# Patient Record
Sex: Female | Born: 1975 | Race: White | Hispanic: No | Marital: Married | Smoking: Never smoker
Health system: Southern US, Community
[De-identification: ages and names within clinical notes are randomized; demographics above are authoritative.]

## PROBLEM LIST (undated history)

## (undated) DIAGNOSIS — F419 Anxiety disorder, unspecified: Secondary | ICD-10-CM

## (undated) HISTORY — PX: WISDOM TOOTH EXTRACTION: SHX21

## (undated) HISTORY — DX: Anxiety disorder, unspecified: F41.9

---

## 2006-07-03 ENCOUNTER — Other Ambulatory Visit: Admission: RE | Admit: 2006-07-03 | Discharge: 2006-07-03 | Payer: Self-pay | Admitting: Obstetrics and Gynecology

## 2008-12-12 ENCOUNTER — Ambulatory Visit: Payer: Self-pay | Admitting: Obstetrics and Gynecology

## 2008-12-12 ENCOUNTER — Inpatient Hospital Stay (HOSPITAL_COMMUNITY): Admission: AD | Admit: 2008-12-12 | Discharge: 2008-12-12 | Payer: Self-pay | Admitting: Obstetrics & Gynecology

## 2009-03-30 ENCOUNTER — Inpatient Hospital Stay (HOSPITAL_COMMUNITY): Admission: AD | Admit: 2009-03-30 | Discharge: 2009-04-02 | Payer: Self-pay | Admitting: Obstetrics and Gynecology

## 2010-12-01 LAB — CBC
HCT: 30.2 % — ABNORMAL LOW (ref 36.0–46.0)
HCT: 38.9 % (ref 36.0–46.0)
Hemoglobin: 10.3 g/dL — ABNORMAL LOW (ref 12.0–15.0)
MCHC: 34.1 g/dL (ref 30.0–36.0)
MCV: 92.1 fL (ref 78.0–100.0)
MCV: 92.3 fL (ref 78.0–100.0)
Platelets: 154 K/uL (ref 150–400)
Platelets: 167 10*3/uL (ref 150–400)
RBC: 3.28 MIL/uL — ABNORMAL LOW (ref 3.87–5.11)
RDW: 14.6 % (ref 11.5–15.5)
RDW: 14.9 % (ref 11.5–15.5)
WBC: 13 K/uL — ABNORMAL HIGH (ref 4.0–10.5)

## 2010-12-01 LAB — SYPHILIS: RPR W/REFLEX TO RPR TITER AND TREPONEMAL ANTIBODIES, TRADITIONAL SCREENING AND DIAGNOSIS ALGORITHM: RPR Ser Ql: NONREACTIVE

## 2010-12-05 LAB — GLUCOSE, CAPILLARY: Glucose-Capillary: 102 mg/dL — ABNORMAL HIGH (ref 70–99)

## 2011-01-08 NOTE — H&P (Signed)
NAMEJAMES, LAFALCE                 ACCOUNT NO.:  [id number]   MEDICAL RECORD NO.:  [id number]          PATIENT TYPE:  INP   LOCATION:  9162                          FACILITY:  [facility]   PHYSICIAN:  Kendra H. Tenny Craw, MD     DATE OF BIRTH:  01-Aug-1976   DATE OF ADMISSION:  03/30/2009  DATE OF DISCHARGE:                              HISTORY & PHYSICAL   CHIEF COMPLAINT:  Contractions.   HISTORY OF PRESENT ILLNESS:  Ms. Tedesco is a 35 year old gravida 2, para  0-0-1-0 who presents at 63 weeks and 2 days estimated gestational age,  complaining of contractions.  She was seen in the office earlier today  and was found to be 4 cm dilated but contracting irregularly.  She  returned later this evening complaining of painful contractions and was  found to be 7 cm dilated, and was admitted to labor and delivery for  labor.  This pregnancy has been relatively uncomplicated except for an  incidental finding of a left lateral uterine fibroid which measures 6 cm  on a first trimester ultrasound.  She also had an abnormal 1-hour  Glucola of 166, but her 3-hour Glucola was normal.   PAST MEDICAL HISTORY:  1. Uterine fibroids.  2. Eczema.   PAST SURGICAL HISTORY:  Oral surgery in childhood.   PAST OB HISTORY:  G2, P 0-0-1-0  1. June 2009: First trimester missed abortion.   PAST GYN HISTORY:  No abnormal Pap smears.  Left lateral uterine fibroid  noted to be 6 cm in a first trimester ultrasound.   MEDICATIONS:  Prenatal vitamins.   ALLERGIES:  SULFA.   SOCIAL HISTORY:  Negative x3.   PRENATAL LABS:  Blood type O+, Rh antibody screen negative, RPR  nonreactive, Rubella titer immune, hepatitis B surface antigen negative,  HIV nonreactive.  Urine cultures:  Group B strep positive.  First  trimester screen:  No increased risk.  Second trimester maternal serum  AFP within normal limits.  A 1-hour Glucola 166.  A 3-hour Glucola  normal.  Pap smear within normal limits.  Gonorrhea and Chlamydia  negative.   PHYSICAL EXAMINATION:  Alert and oriented x3.  Uncomfortable-appearing.  ABDOMEN:  Gravid, soft, nontender.  CERVIX:  790, bulging bag.  Fetal heart tones 140 to 150, reactive.  No decels.  Toco every 2 to 4  minutes.   ASSESSMENT AND PLAN:  This is a 35 year old gravida 2, para 0-0-1-0 at  39 weeks and 2 days in labor.  1. Will admit to labor and delivery.  2. Given group B positive status and advanced cervical dilation, we      will administer ampicillin for group B strep prophylaxis.  3)      Patient may have an epidural upon request.      Freddrick March. Tenny Craw, MD  Electronically Signed     [facility]MED[facility]  D:  03/30/2009  T:  03/30/2009  Job:  [id number]

## 2015-06-15 ENCOUNTER — Other Ambulatory Visit (HOSPITAL_COMMUNITY): Payer: Self-pay | Admitting: Family Medicine

## 2015-06-15 ENCOUNTER — Ambulatory Visit (HOSPITAL_COMMUNITY): Payer: Self-pay

## 2015-06-15 DIAGNOSIS — R102 Pelvic and perineal pain: Secondary | ICD-10-CM

## 2015-06-15 DIAGNOSIS — R1030 Lower abdominal pain, unspecified: Secondary | ICD-10-CM

## 2015-06-15 DIAGNOSIS — N912 Amenorrhea, unspecified: Secondary | ICD-10-CM

## 2016-07-03 ENCOUNTER — Other Ambulatory Visit: Payer: Self-pay | Admitting: Obstetrics and Gynecology

## 2016-07-03 DIAGNOSIS — R928 Other abnormal and inconclusive findings on diagnostic imaging of breast: Secondary | ICD-10-CM

## 2016-07-15 ENCOUNTER — Ambulatory Visit
Admission: RE | Admit: 2016-07-15 | Discharge: 2016-07-15 | Disposition: A | Discharge status: Home / Self Care | Payer: Self-pay | Source: Ambulatory Visit | Attending: Obstetrics and Gynecology | Admitting: Obstetrics and Gynecology

## 2016-07-15 ENCOUNTER — Ambulatory Visit
Admission: RE | Admit: 2016-07-15 | Discharge: 2016-07-15 | Disposition: A | Discharge status: Home / Self Care | Payer: BLUE CROSS/BLUE SHIELD | Source: Ambulatory Visit | Attending: Obstetrics and Gynecology | Admitting: Obstetrics and Gynecology

## 2016-07-15 DIAGNOSIS — R928 Other abnormal and inconclusive findings on diagnostic imaging of breast: Secondary | ICD-10-CM

## 2016-07-15 IMAGING — MG 2D DIGITAL DIAGNOSTIC UNILATERAL LEFT MAMMOGRAM WITH CAD AND ADJ
3 series · 3 of 7 positions shown · non-contrast
Comparison: Baseline screening mammogram dated [DATE].

CLINICAL DATA: Patient was called back from screening mammogram for
an asymmetry in the left breast.

EXAM:
2D DIGITAL DIAGNOSTIC LEFT MAMMOGRAM WITH CAD AND ADJUNCT TOMO
ULTRASOUND LEFT BREAST

[L MLO synth-2D]
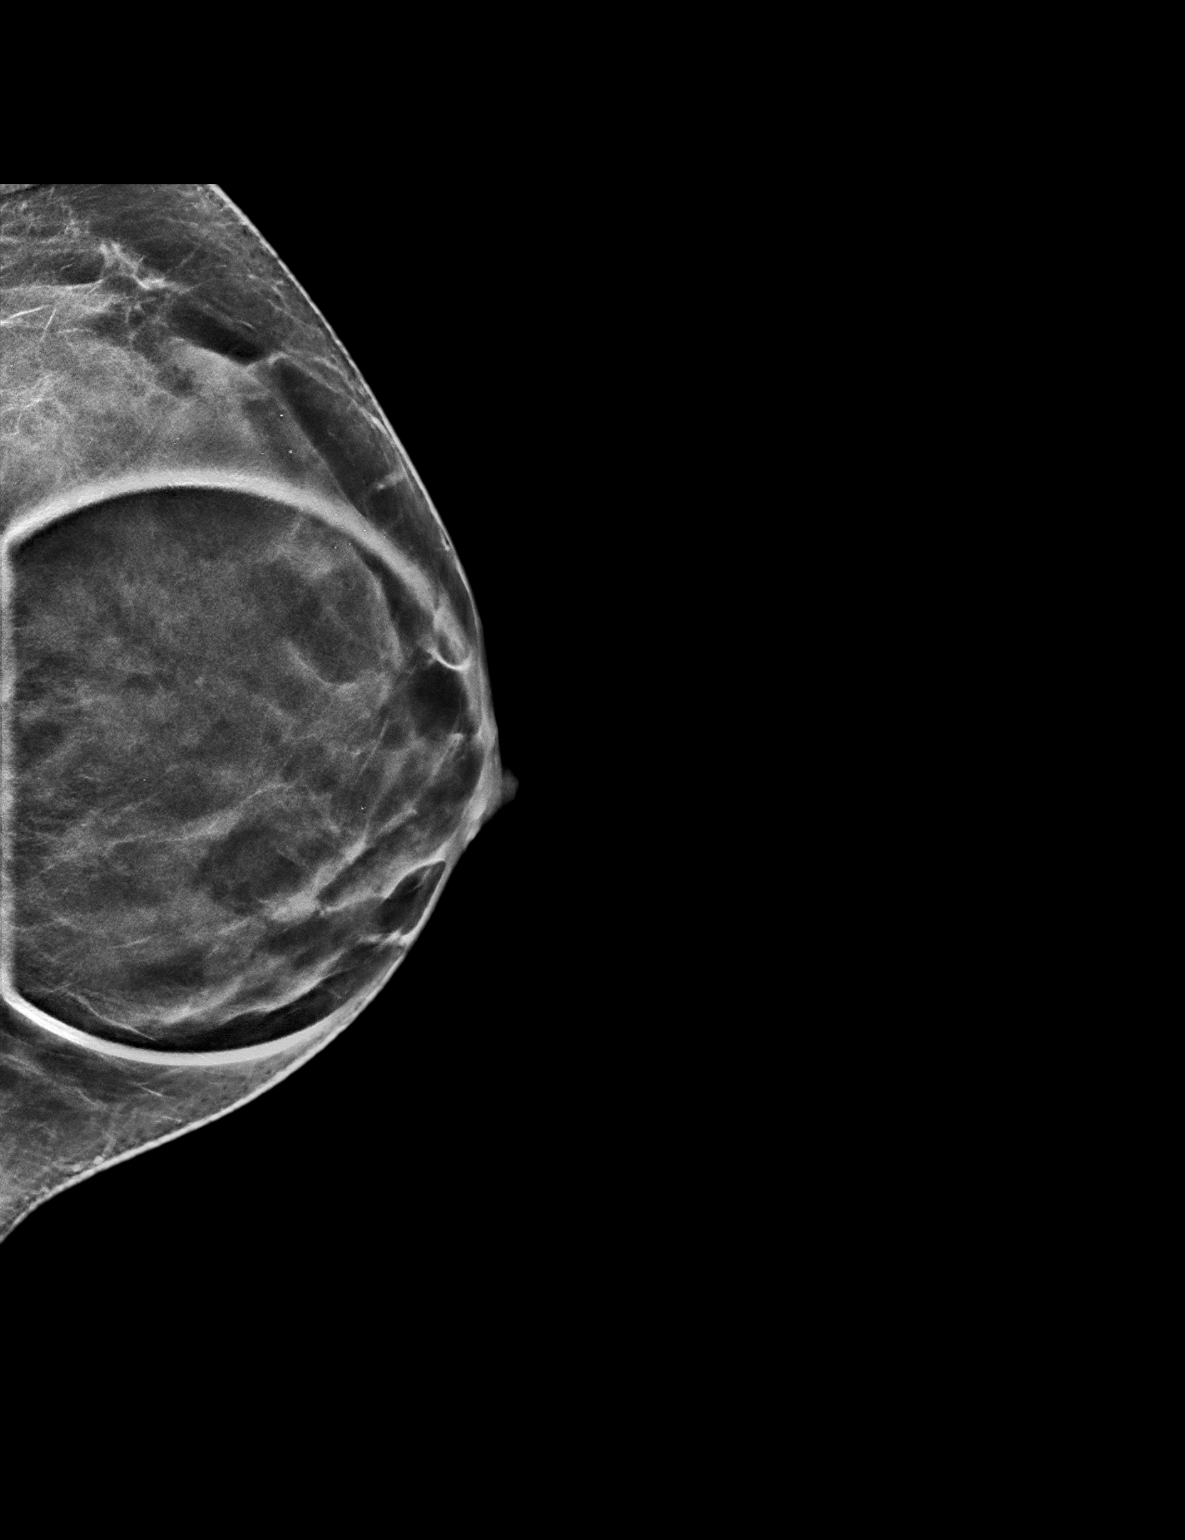

[L MLO]
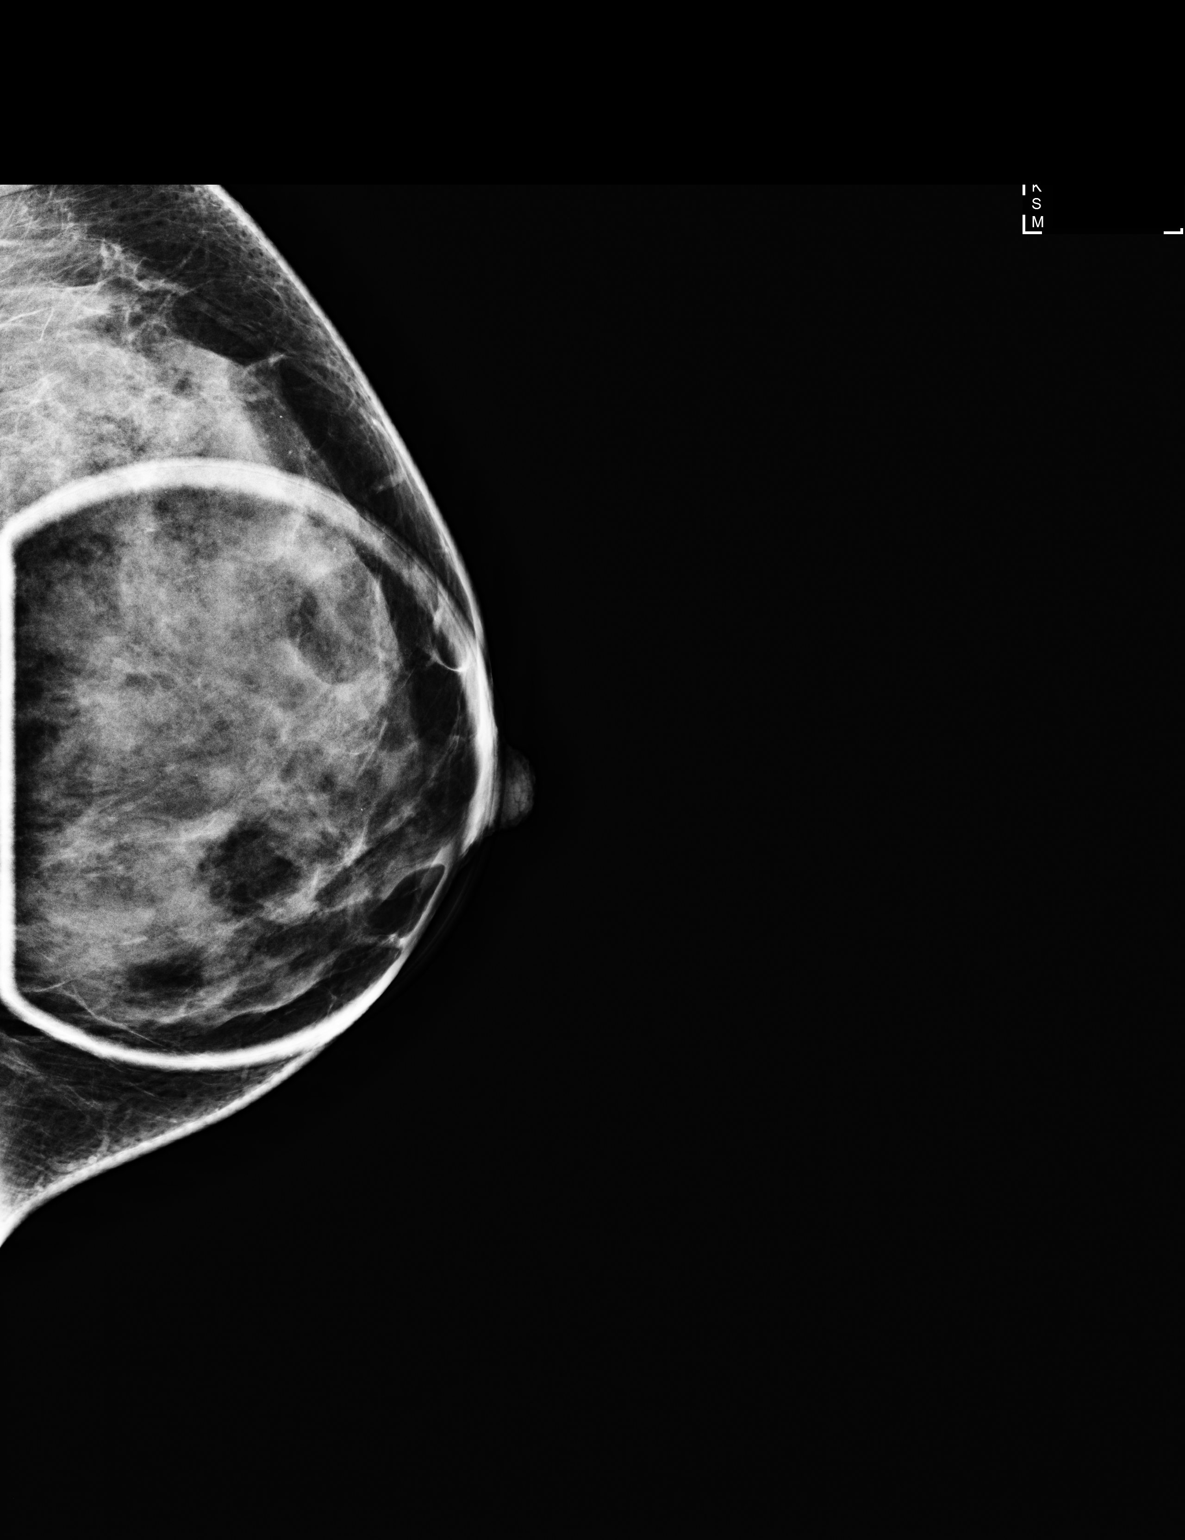

[L CC tomo · tomo slice 26/51.0]
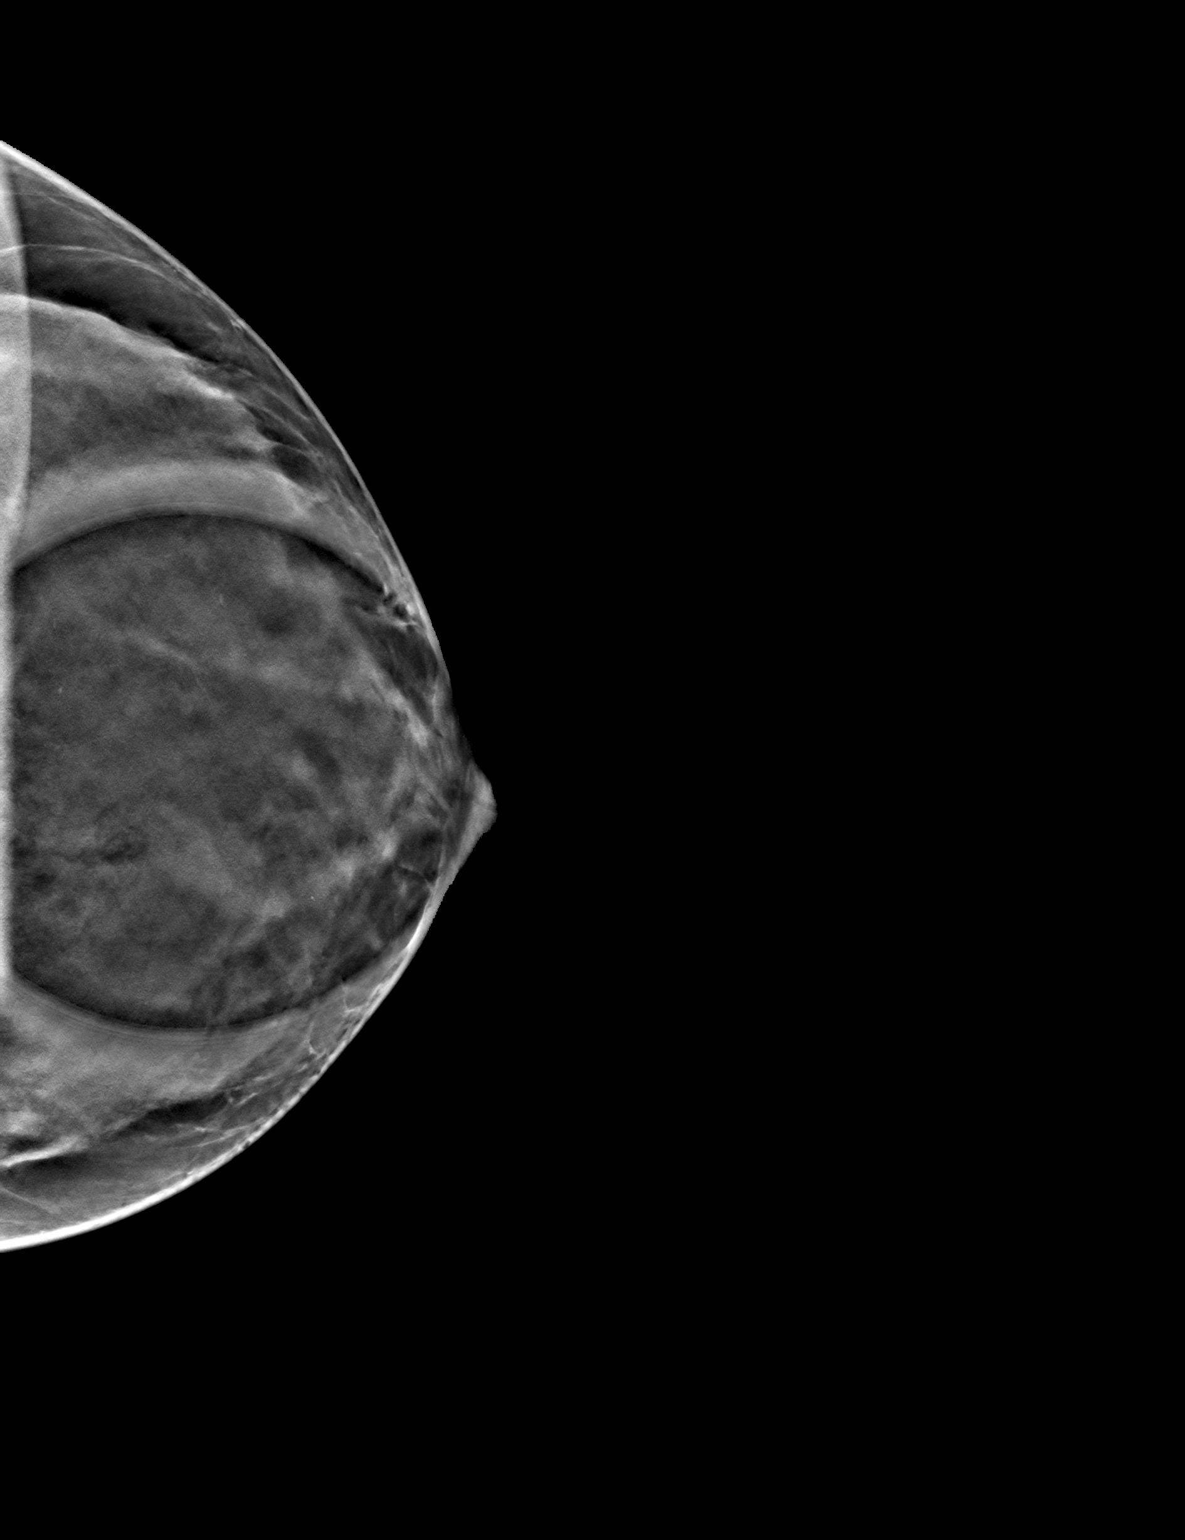

[3 of 7 positions shown; findings below may reference images not displayed]

ACR Breast Density Category c: The breast tissue is heterogeneously
dense, which may obscure small masses.
FINDINGS: Additional imaging of the left breast was performed. No suspicious
mass, distortion or malignant type microcalcifications identified in
the left breast.

Mammographic images were processed with CAD.

On physical exam, I do not palpate a mass in the left breast.

Targeted ultrasound is performed, showing near anechoic cysts
(apocrine metaplasia) in the left breast at 5 o'clock 1 cm from the
nipple measuring 4 x 3 x 5 mm.
IMPRESSION: Probable benign cysts in the left breast.

RECOMMENDATION:
Short-term interval follow-up left breast ultrasound in 6 months is
recommended.

I have discussed the findings and recommendations with the patient.
Results were also provided in writing at the conclusion of the
visit. If applicable, a reminder letter will be sent to the patient
regarding the next appointment.

BI-RADS CATEGORY  3: Probably benign.

## 2017-07-04 ENCOUNTER — Other Ambulatory Visit: Payer: Self-pay | Admitting: Obstetrics and Gynecology

## 2017-07-04 DIAGNOSIS — N6489 Other specified disorders of breast: Secondary | ICD-10-CM

## 2017-07-09 ENCOUNTER — Ambulatory Visit
Admission: RE | Admit: 2017-07-09 | Discharge: 2017-07-09 | Disposition: A | Discharge status: Home / Self Care | Payer: BLUE CROSS/BLUE SHIELD | Source: Ambulatory Visit | Attending: Obstetrics and Gynecology | Admitting: Obstetrics and Gynecology

## 2017-07-09 DIAGNOSIS — N6489 Other specified disorders of breast: Secondary | ICD-10-CM

## 2017-07-09 IMAGING — MG 2D DIGITAL DIAGNOSTIC BILATERAL MAMMOGRAM WITH CAD AND ADJUNCT T
8 of 12 series · 8 of 28 positions shown · non-contrast
Comparison: Previous exam(s).

CLINICAL DATA: Diagnostic report of [DATE] described a probably
benign cyst in the left breast with recommendation for six-month
follow-up diagnostic exam. Patient returns today for the follow-up
diagnostic exam.

EXAM:
2D DIGITAL DIAGNOSTIC BILATERAL MAMMOGRAM WITH CAD AND ADJUNCT TOMO
ULTRASOUND LEFT BREAST

[R CC synth-2D]
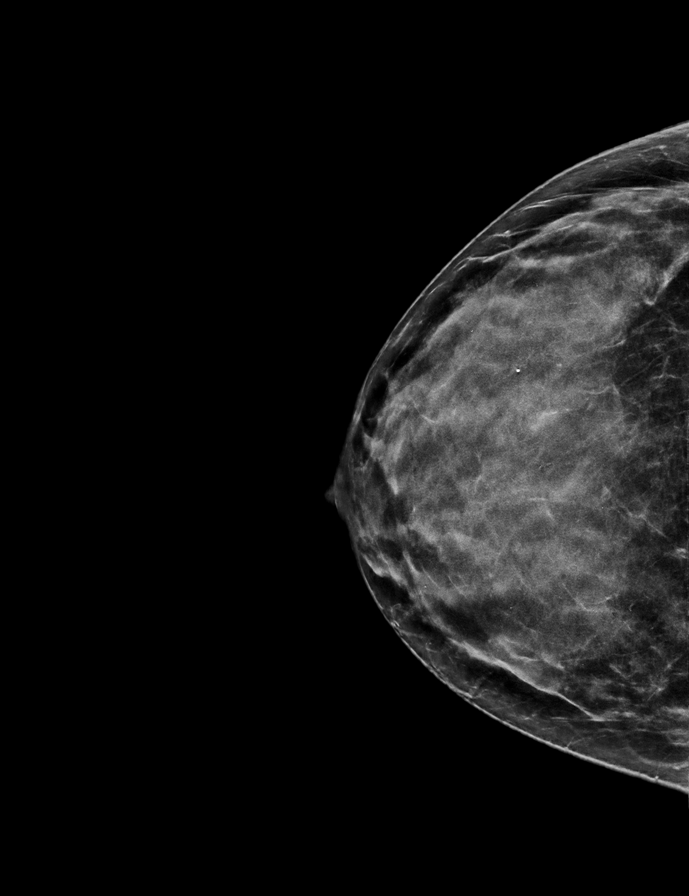

[L CC]
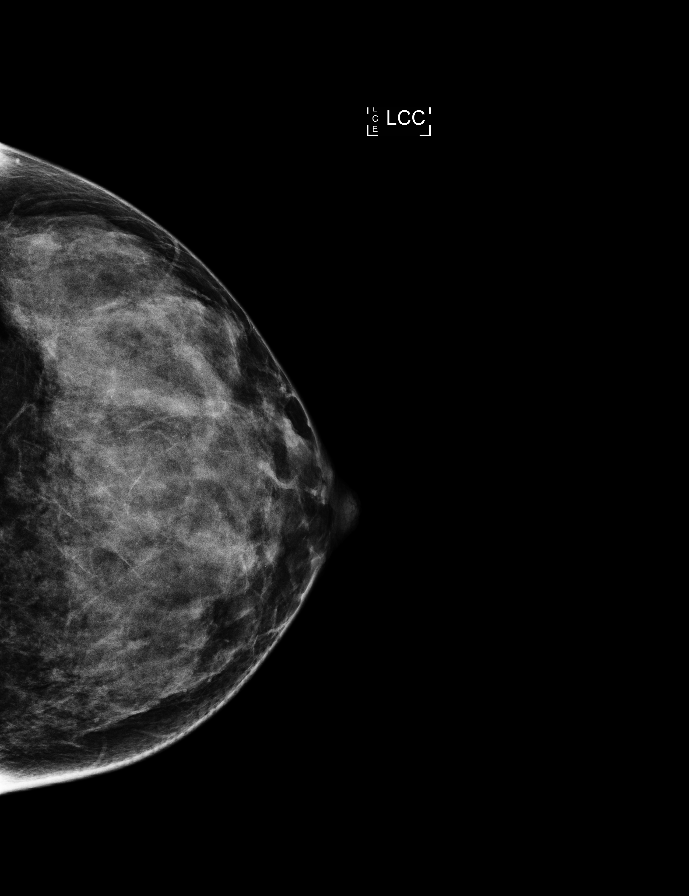

[R MLO]
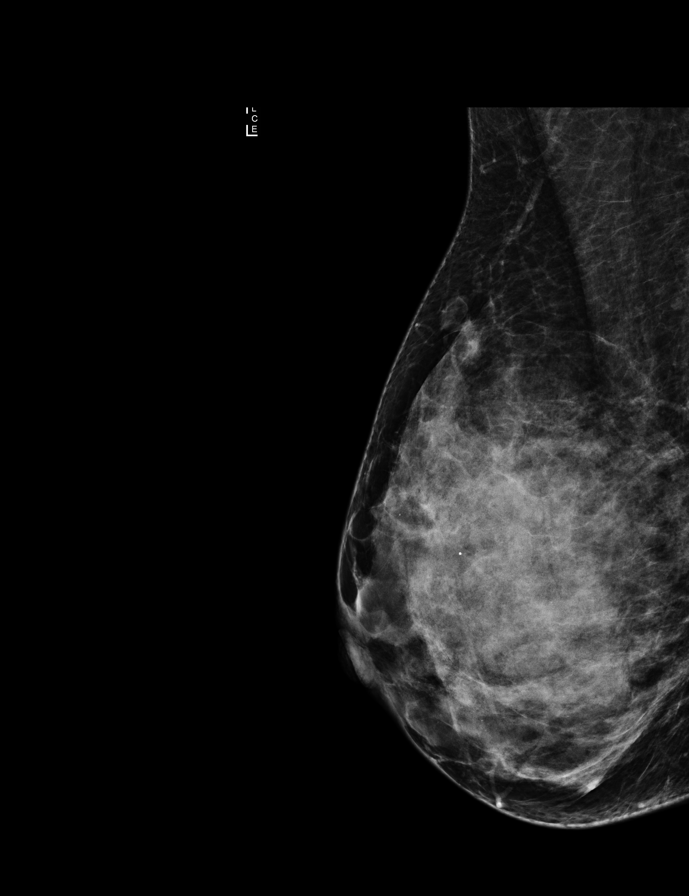

[L MLO synth-2D]
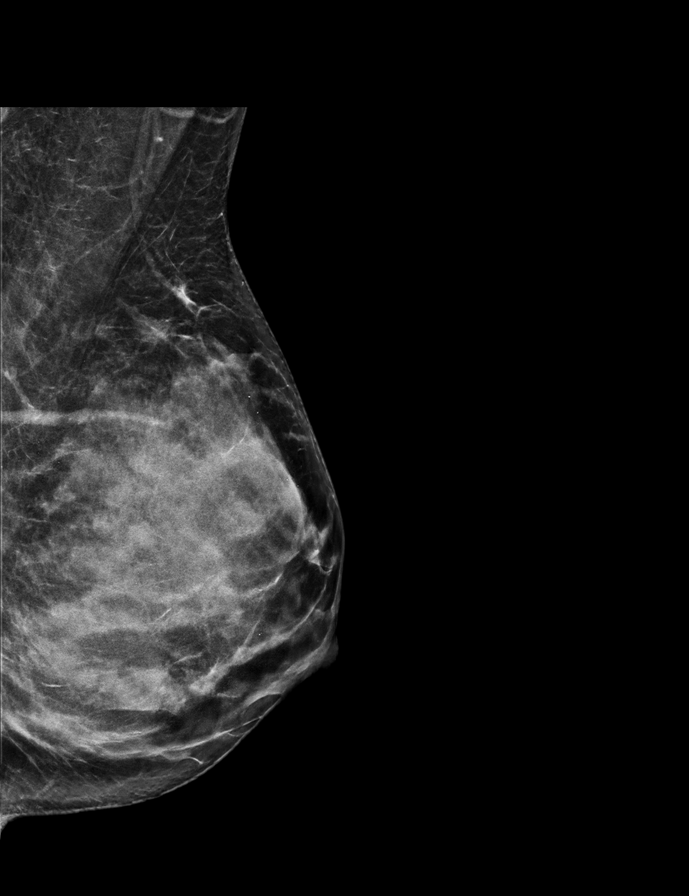

[L CC synth-2D]
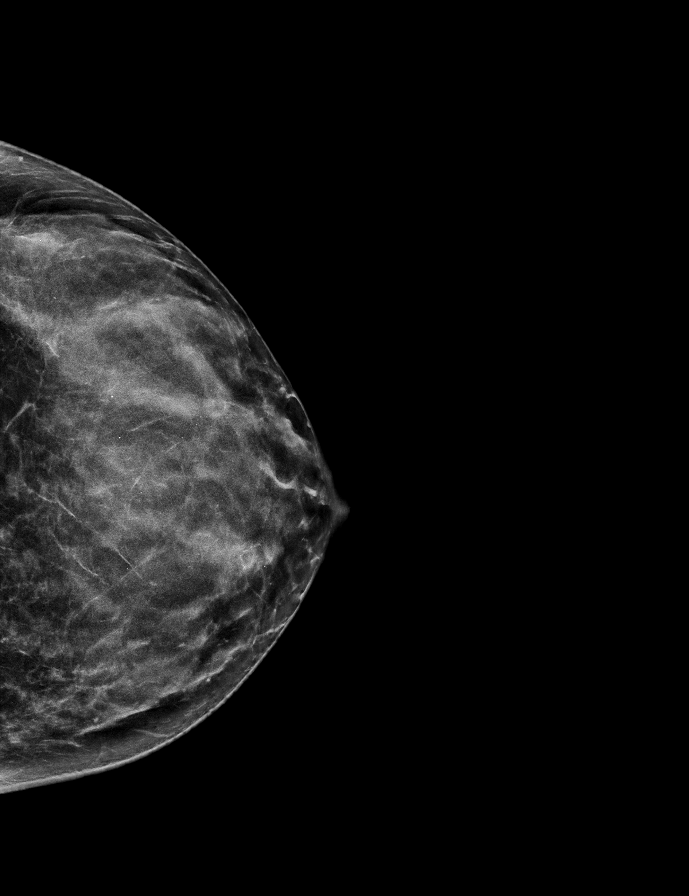

[R CC]
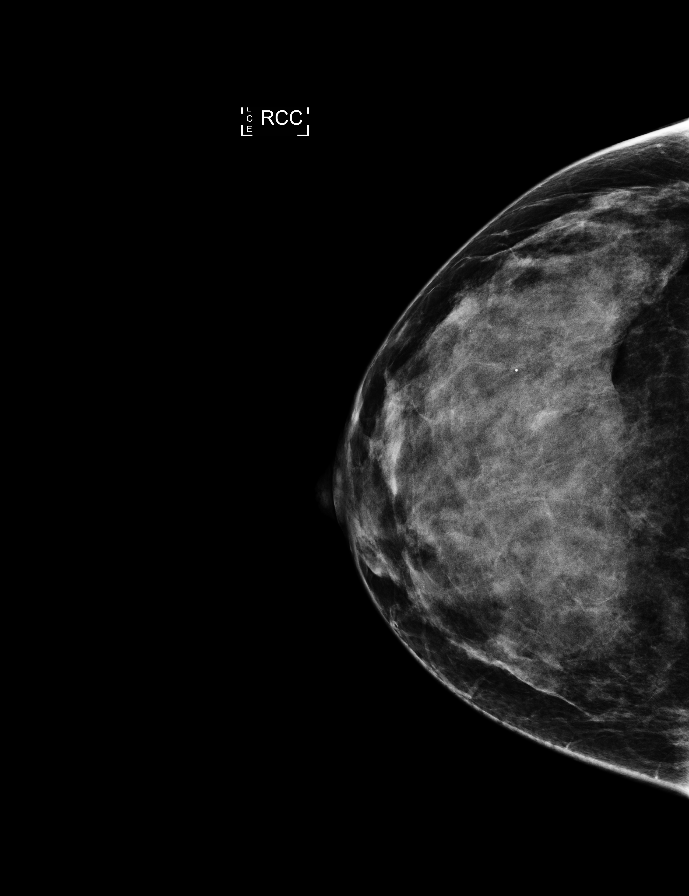

[R MLO synth-2D]
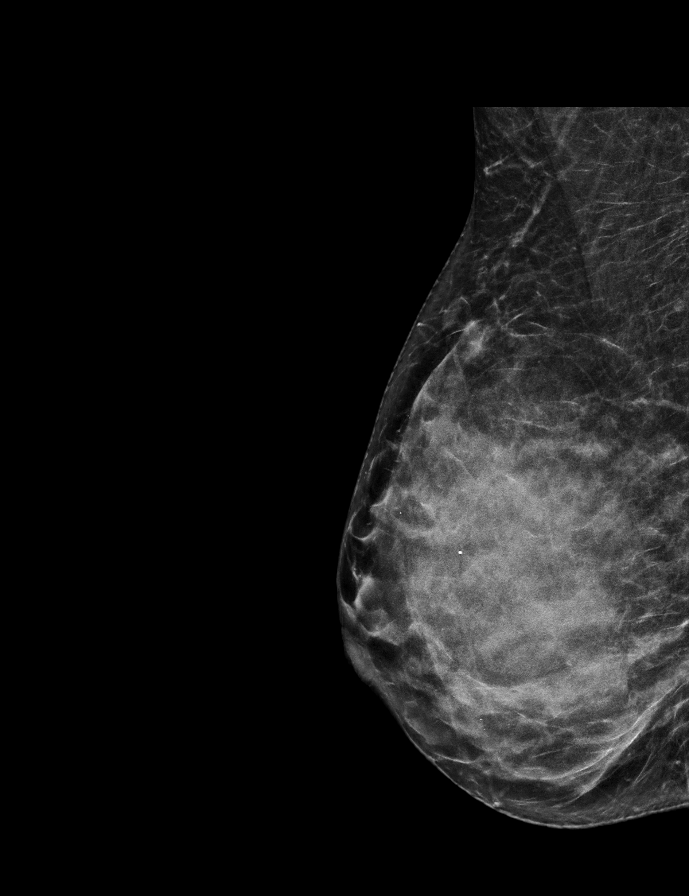

[L MLO]
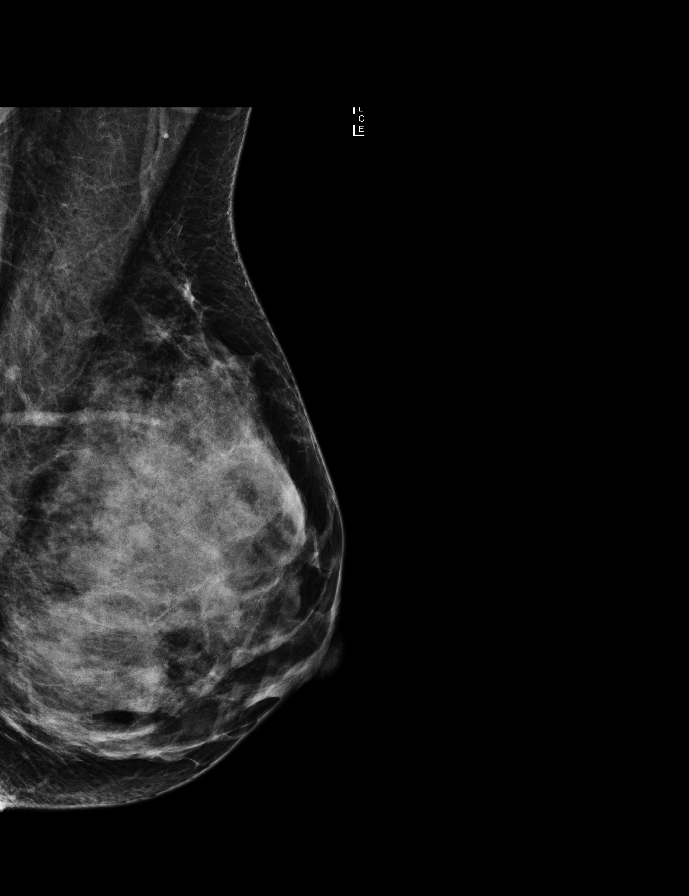

[8 of 28 positions shown; findings below may reference images not displayed]

ACR Breast Density Category d: The breast tissue is extremely dense,
which lowers the sensitivity of mammography.
FINDINGS: Bilateral 2D CC and MLO views were obtained today, with additional
3D tomosynthesis. There are no dominant masses, suspicious
calcifications or secondary signs of malignancy identified within
either breast on today's exam.

Mammographic images were processed with CAD.

Targeted ultrasound is performed, showing interval resolution of the
previously demonstrated cyst at the 5 o'clock axis confirming
benignity. There is a benign cluster of cysts in the left breast at
the 4 o'clock axis, 3 cm from the nipple, corresponding as an
incidental finding. Additional smaller cysts are seen within the
lower left breast, also corresponding as incidental findings. No
suspicious solid or cystic mass is identified within the lower left
breast on today's ultrasound.
IMPRESSION: No evidence of malignancy within either breast. Benign cysts within
the lower left breast.

Patient may return to routine annual bilateral screening mammogram
schedule.

RECOMMENDATION:
Screening mammogram in one year.(Code:[62])

I have discussed the findings and recommendations with the patient.
Results were also provided in writing at the conclusion of the
visit. If applicable, a reminder letter will be sent to the patient
regarding the next appointment.

BI-RADS CATEGORY  2: Benign.

## 2017-07-09 IMAGING — US ULTRASOUND LEFT BREAST LIMITED
1 series · 4 of 4 positions shown · non-contrast
Comparison: Previous exam(s).

CLINICAL DATA: Diagnostic report of [DATE] described a probably
benign cyst in the left breast with recommendation for six-month
follow-up diagnostic exam. Patient returns today for the follow-up
diagnostic exam.

EXAM:
2D DIGITAL DIAGNOSTIC BILATERAL MAMMOGRAM WITH CAD AND ADJUNCT TOMO
ULTRASOUND LEFT BREAST

[Series 1: ultrasound left breast limited · 0.06mm/px · 4 of 4 slices shown]
[im 1/4]
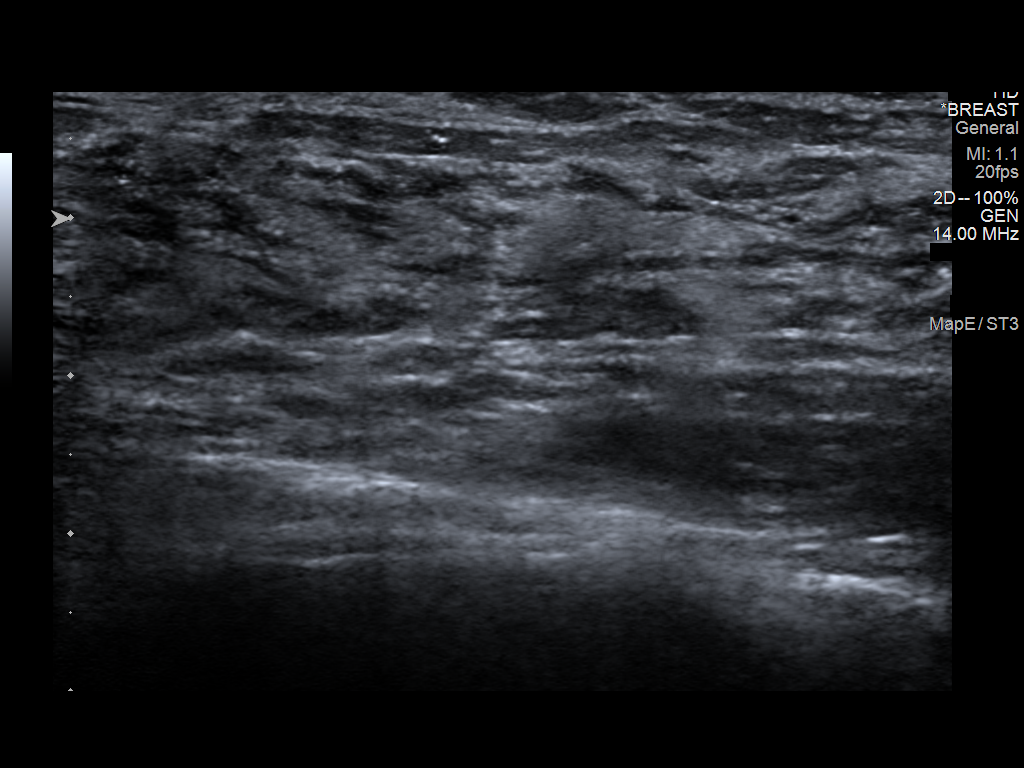
[im 2/4]
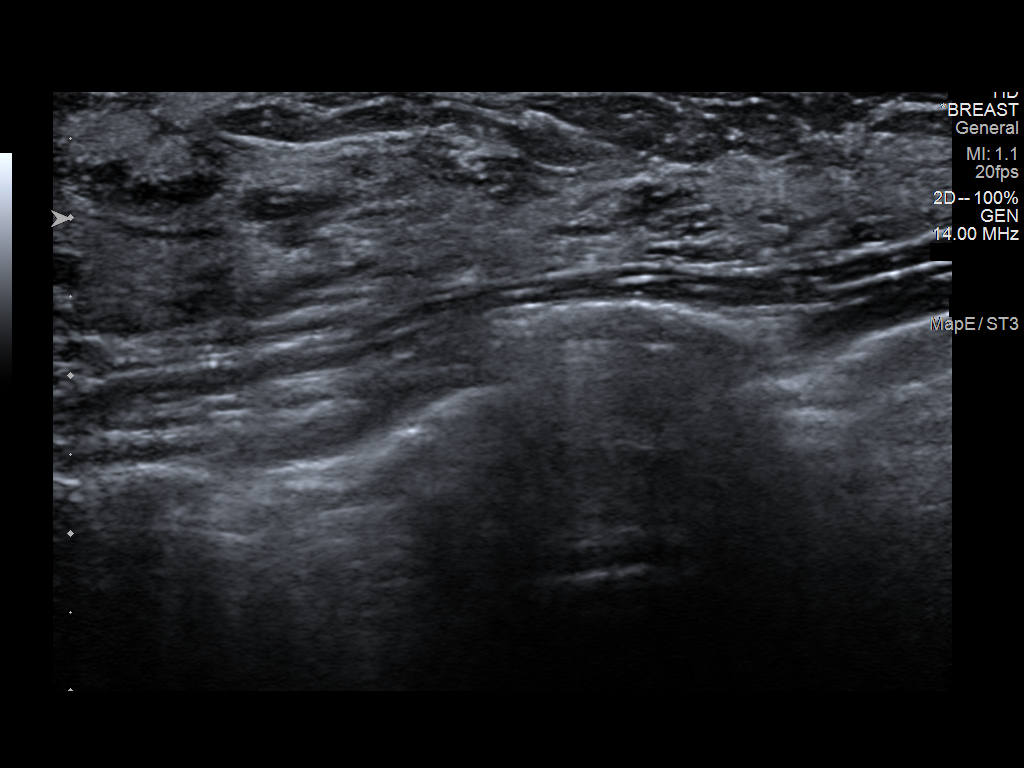
[im 3/4]
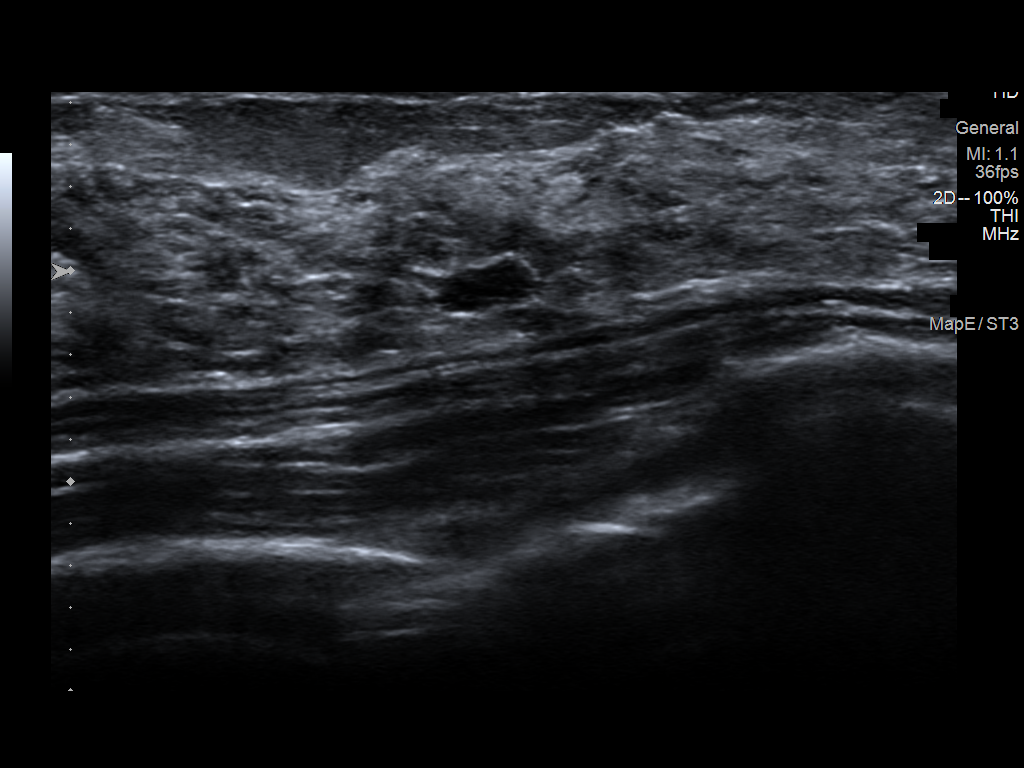
[im 4/4]
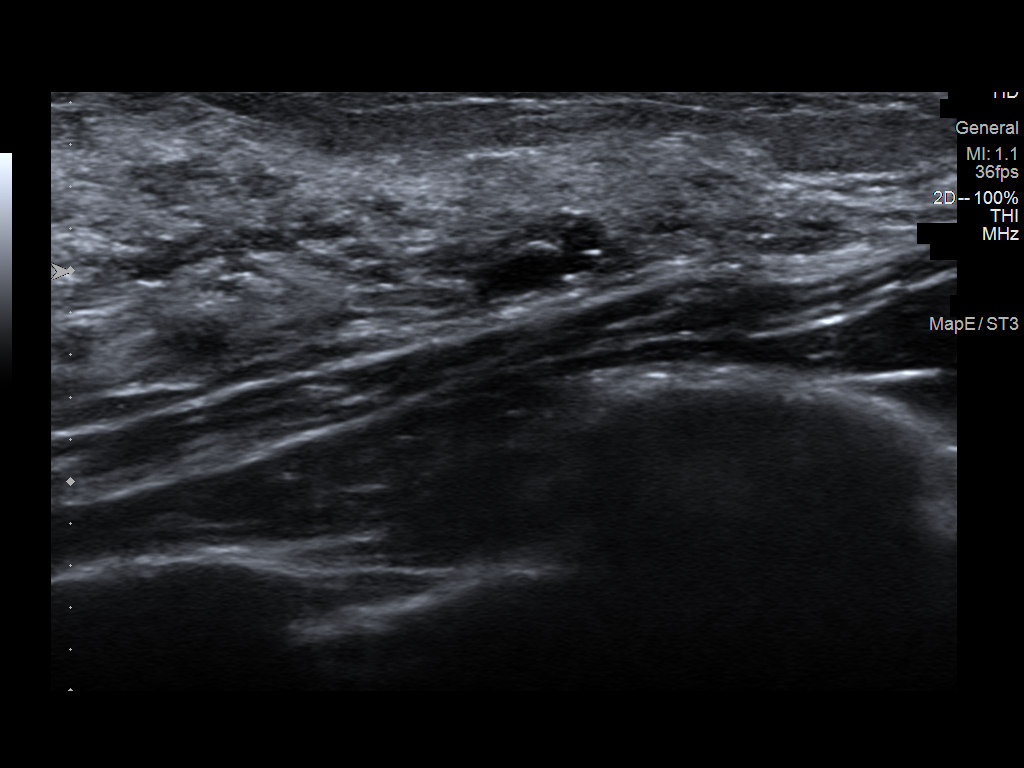

[4 of 4 positions shown; findings below may reference images not displayed]

ACR Breast Density Category d: The breast tissue is extremely dense,
which lowers the sensitivity of mammography.
FINDINGS: Bilateral 2D CC and MLO views were obtained today, with additional
3D tomosynthesis. There are no dominant masses, suspicious
calcifications or secondary signs of malignancy identified within
either breast on today's exam.

Mammographic images were processed with CAD.

Targeted ultrasound is performed, showing interval resolution of the
previously demonstrated cyst at the 5 o'clock axis confirming
benignity. There is a benign cluster of cysts in the left breast at
the 4 o'clock axis, 3 cm from the nipple, corresponding as an
incidental finding. Additional smaller cysts are seen within the
lower left breast, also corresponding as incidental findings. No
suspicious solid or cystic mass is identified within the lower left
breast on today's ultrasound.
IMPRESSION: No evidence of malignancy within either breast. Benign cysts within
the lower left breast.

Patient may return to routine annual bilateral screening mammogram
schedule.

RECOMMENDATION:
Screening mammogram in one year.(Code:[62])

I have discussed the findings and recommendations with the patient.
Results were also provided in writing at the conclusion of the
visit. If applicable, a reminder letter will be sent to the patient
regarding the next appointment.

BI-RADS CATEGORY  2: Benign.

## 2020-08-26 HISTORY — PX: WRIST SURGERY: SHX841

## 2020-09-20 ENCOUNTER — Other Ambulatory Visit: Payer: Self-pay | Admitting: Obstetrics and Gynecology

## 2020-09-20 DIAGNOSIS — Z9189 Other specified personal risk factors, not elsewhere classified: Secondary | ICD-10-CM

## 2020-10-08 ENCOUNTER — Other Ambulatory Visit: Payer: Self-pay

## 2020-10-08 ENCOUNTER — Ambulatory Visit
Admission: RE | Admit: 2020-10-08 | Discharge: 2020-10-08 | Disposition: A | Discharge status: Home / Self Care | Payer: BC Managed Care – PPO | Source: Ambulatory Visit | Attending: Obstetrics and Gynecology | Admitting: Obstetrics and Gynecology

## 2020-10-08 DIAGNOSIS — Z9189 Other specified personal risk factors, not elsewhere classified: Secondary | ICD-10-CM

## 2020-10-08 IMAGING — MR MR BREAST BILAT WO/W CM
16 of 24 series · 32 of 48 positions shown · IV contrast (gadavist)
Comparison: Previous exam(s).

CLINICAL DATA: 44-year-old female presenting for high risk
screening MRI. Mother, grandmother and aunt all diagnosed with
breast cancer. No current breast related symptoms.

LABS:  None performed on site.
EXAM:
BILATERAL BREAST MRI WITH AND WITHOUT CONTRAST
TECHNIQUE: Multiplanar, multisequence MR images of both breasts were obtained
prior to and following the intravenous administration of 10 ml of
Gadavist.

[Series 2: t2_tirm_tra ipat (a-p) · axial · 3.0mm · 0.66mm/px · 1 of 55 slices shown (1 of 2)]
[im 1/55]
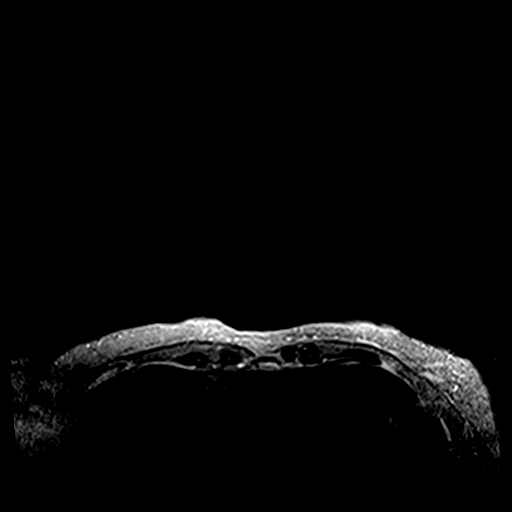

[Series 2: t2_tirm_tra ipat (a-p) · axial · 3.0mm · 0.70mm/px · 1 of 55 slices shown (2 of 2)]
[im 1/55]
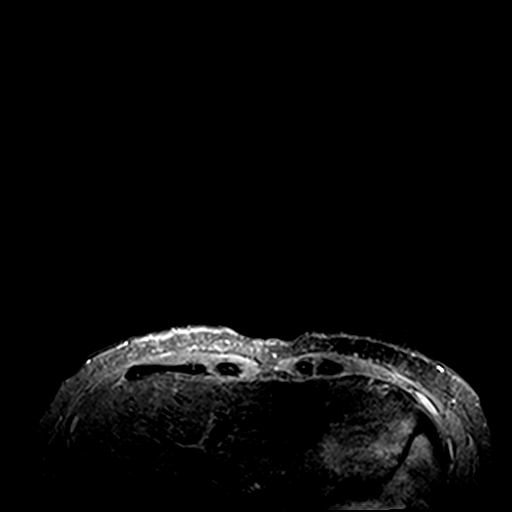

[Series 3: fl3d pre-cm no · axial · non-contrast · 1.2mm · 0.89mm/px · z∈[-50,+121]mm · 2 of 144 slices shown (1 of 2)]
[im 1/144]
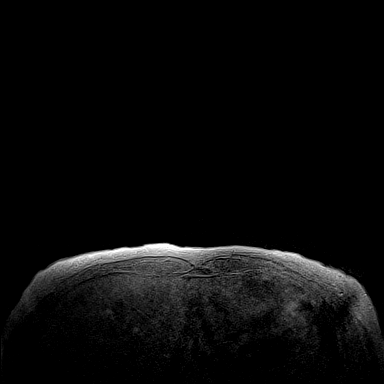
[im 144/144]
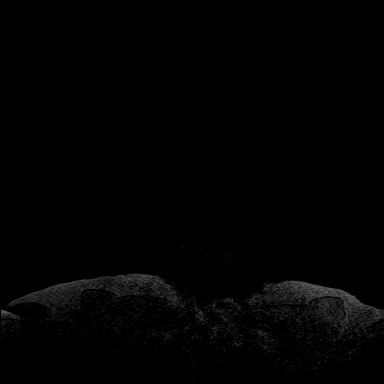

[Series 3: fl3d pre-cm no · axial · non-contrast · 1.2mm · 0.94mm/px · z∈[-45,+126]mm · 2 of 144 slices shown (2 of 2)]
[im 1/144]
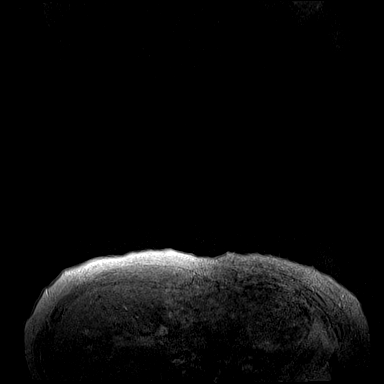
[im 144/144]
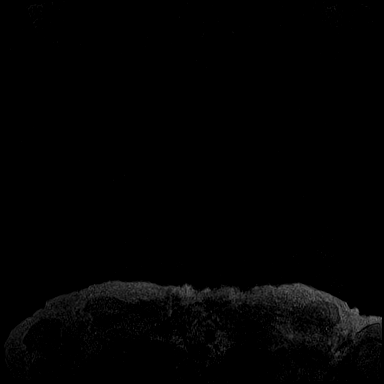

[Series 4: fl3d pre-cm · axial · non-contrast · 1.2mm · 0.89mm/px · z∈[-50,+121]mm · 2 of 144 slices shown (1 of 2)]
[im 1/144]
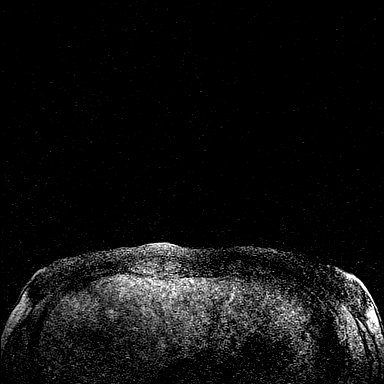
[im 144/144]
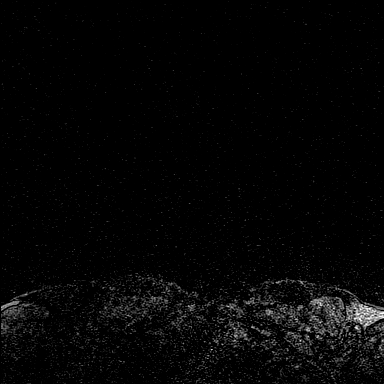

[Series 4: fl3d pre-cm · axial · non-contrast · 1.2mm · 0.94mm/px · z∈[-45,+126]mm · 2 of 144 slices shown (2 of 2)]
[im 1/144]
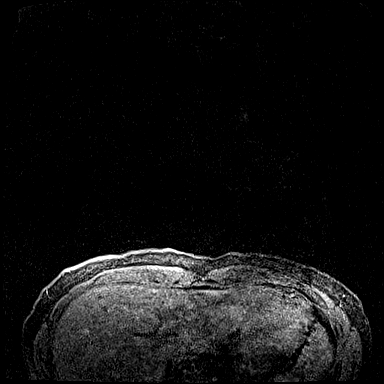
[im 144/144]
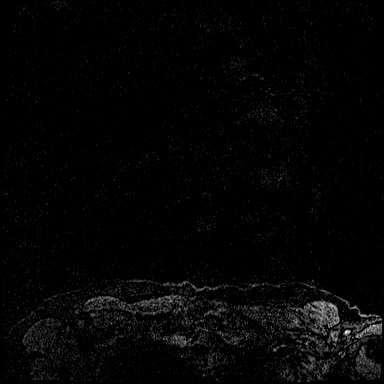

[Series 5: fl3d post immediate · axial · 1.2mm · 0.94mm/px · z∈[-45,+126]mm · 2 of 144 slices shown (1 of 3)]
[im 1/144]
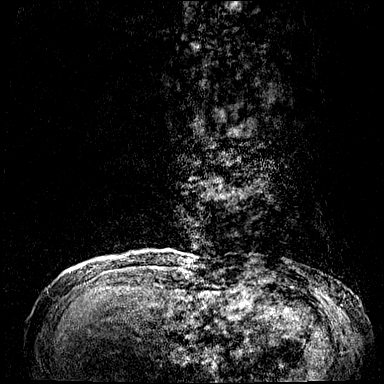
[im 144/144]
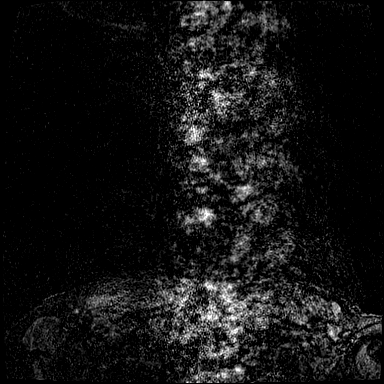

[Series 5: fl3d post-cm 20 · axial · 1.2mm · 0.89mm/px · z∈[-50,+121]mm · 2 of 144 slices shown (1 of 3)]
[im 1/144]
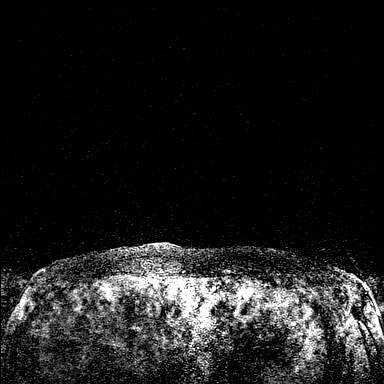
[im 144/144]
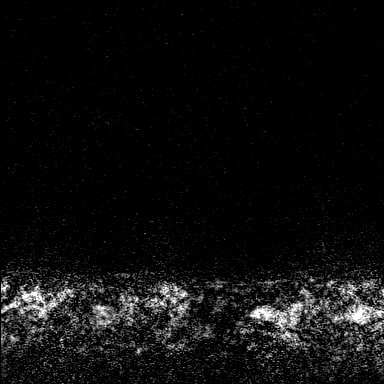

[Series 6: fl3d post-cm 20 · axial · 1.2mm · 0.89mm/px · z∈[-50,+121]mm · 2 of 144 slices shown (2 of 3)]
[im 1/144]
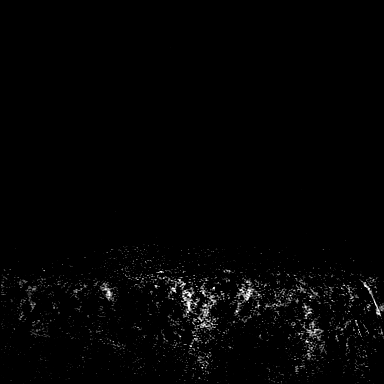
[im 144/144]
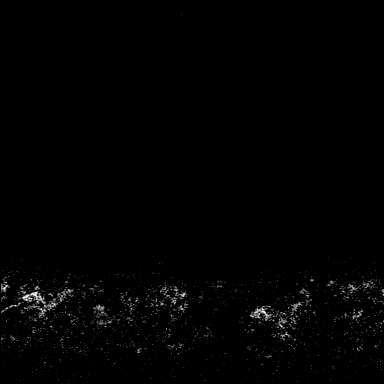

[Series 6: fl3d post immediate · axial · 1.2mm · 0.94mm/px · z∈[-45,+126]mm · 2 of 144 slices shown (2 of 3)]
[im 1/144]
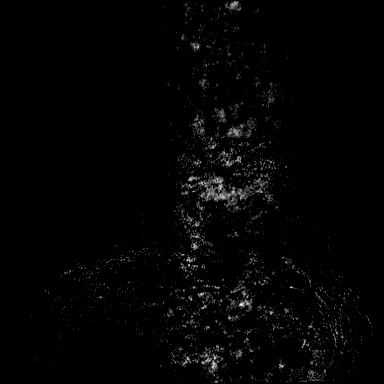
[im 144/144]
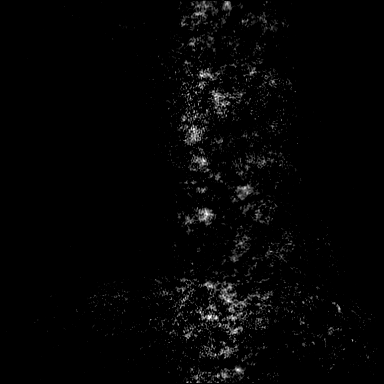

[Series 7: fl3d post-cm 20 · axial · 172.8mm · 0.89mm/px · 1 of 1 slices shown (3 of 3)]
[im 1/1]
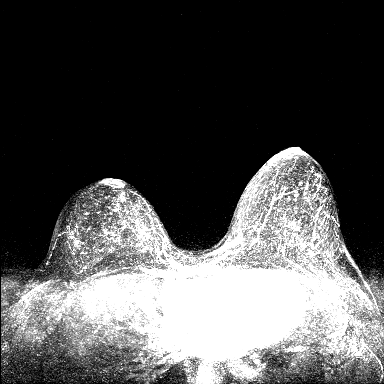

[Series 7: fl3d post immediate · axial · 172.8mm · 0.94mm/px · 1 of 1 slices shown (3 of 3)]
[im 1/1]
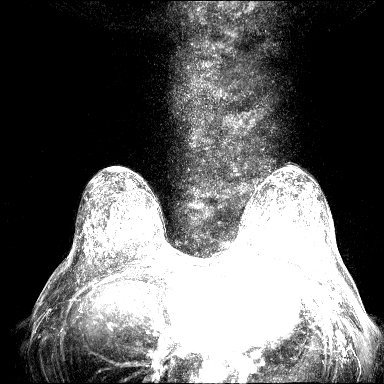

[Series 8: fl3d post-cm 3min · axial · 1.2mm · 0.89mm/px · z∈[-50,+121]mm · 3 of 144 slices shown]
[im 1/144]
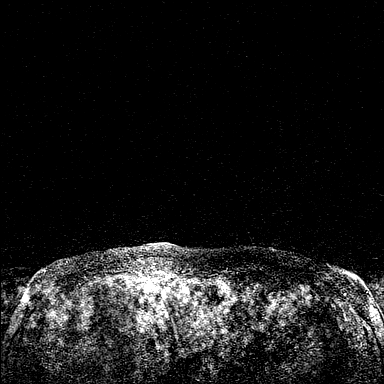
[im 72/144]
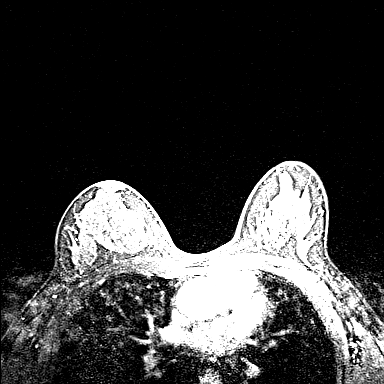
[im 144/144]
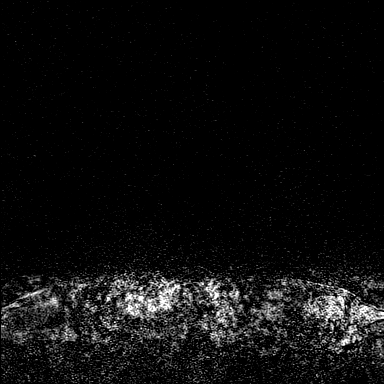

[Series 8: fl3d post 3min · axial · 1.2mm · 0.94mm/px · z∈[-45,+126]mm · 3 of 144 slices shown]
[im 1/144]
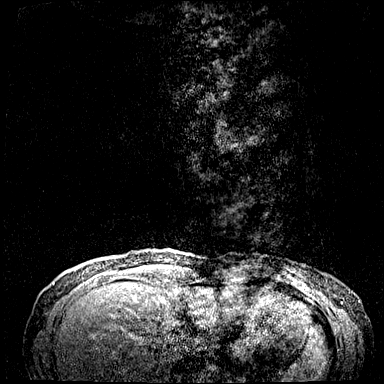
[im 72/144]
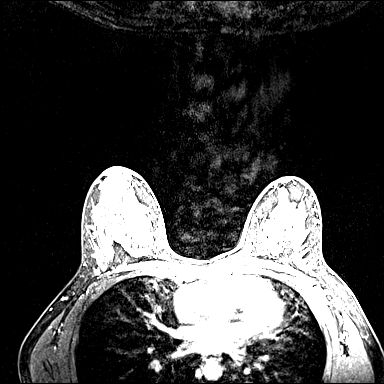
[im 144/144]
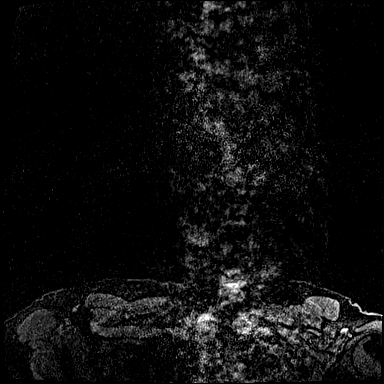

[Series 9: fl3d post-cm 3min_sub · axial · 1.2mm · 0.89mm/px · z∈[-50,+121]mm · 3 of 144 slices shown]
[im 1/144]
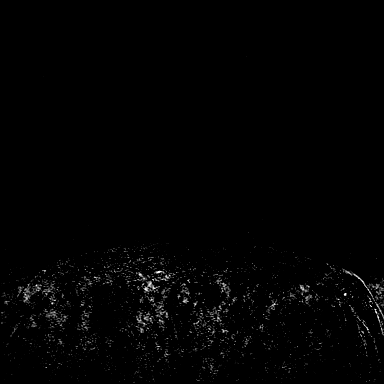
[im 72/144]
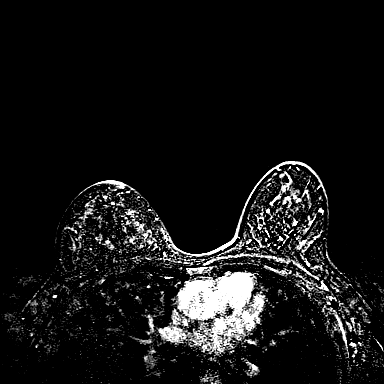
[im 144/144]
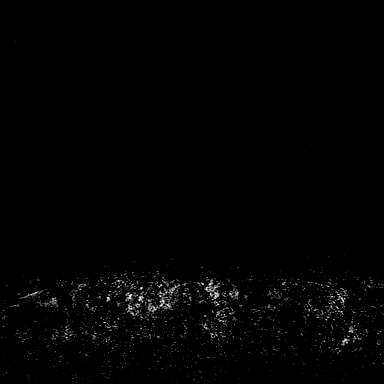

[Series 9: fl3d post 3min_sub · axial · 1.2mm · 0.94mm/px · z∈[-45,+126]mm · 3 of 144 slices shown]
[im 1/144]
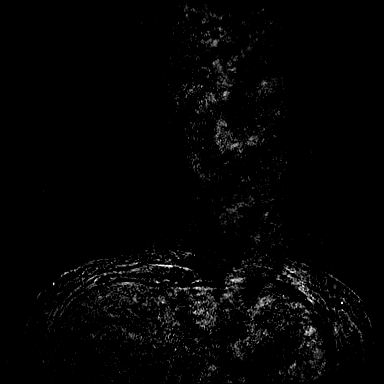
[im 72/144]
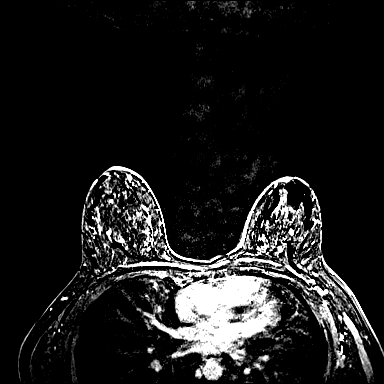
[im 144/144]
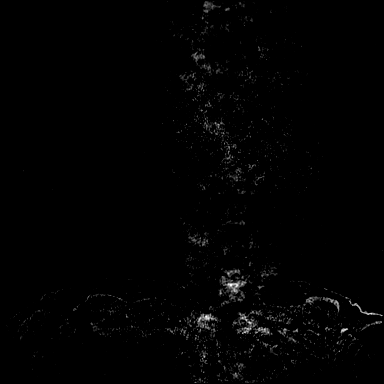

[32 of 48 positions shown; findings below may reference images not displayed]

Three-dimensional MR images were rendered by post-processing of the
original MR data on an independent workstation. The
three-dimensional MR images were interpreted, and findings are
reported in the following complete MRI report for this study. Three
dimensional images were evaluated at the independent interpreting
workstation using the DynaCAD thin client.
FINDINGS: Breast composition: d. Extreme fibroglandular tissue.

Background parenchymal enhancement: Marked.

Right breast: No suspicious mass or abnormal enhancement.

Left breast: No suspicious mass or abnormal enhancement.

Lymph nodes: No abnormal appearing lymph nodes.

Ancillary findings:  None.
IMPRESSION: No MRI evidence of malignancy in either breast. Please note,
evaluation is limited in the setting of marked background
parenchymal enhancement.

RECOMMENDATION:
Routine annual screening. The patient is due for her next screening
mammogram in [DATE].

BI-RADS CATEGORY  1: Negative.

## 2020-10-08 MED ORDER — GADOBUTROL 1 MMOL/ML IV SOLN
10.0000 mL | Freq: Once | INTRAVENOUS | Status: AC | PRN
  Administered 2020-10-08: 10 mL via INTRAVENOUS

## 2020-10-10 ENCOUNTER — Other Ambulatory Visit: Payer: Self-pay

## 2020-10-10 ENCOUNTER — Inpatient Hospital Stay: Admission: RE | Admit: 2020-10-10 | Payer: BC Managed Care – PPO | Source: Ambulatory Visit

## 2021-11-08 ENCOUNTER — Other Ambulatory Visit: Payer: Self-pay | Admitting: Obstetrics and Gynecology

## 2021-11-16 ENCOUNTER — Ambulatory Visit
Admission: RE | Admit: 2021-11-16 | Discharge: 2021-11-16 | Disposition: A | Discharge status: Home / Self Care | Payer: BC Managed Care – PPO | Source: Ambulatory Visit | Attending: Obstetrics and Gynecology | Admitting: Obstetrics and Gynecology

## 2021-11-16 ENCOUNTER — Other Ambulatory Visit: Payer: Self-pay

## 2021-11-16 DIAGNOSIS — Z9189 Other specified personal risk factors, not elsewhere classified: Secondary | ICD-10-CM

## 2021-11-16 IMAGING — MR MR BREAST BILAT WO/W CM
8 of 12 series · 33 of 48 positions shown · IV contrast (7 ml gadavist)
Comparison: Previous exam(s).

CLINICAL DATA: 45-year-old female presenting for high risk
screening MRI. She reports family history of breast cancer in her
mother at age 67, grandmother in her 50s and maternal aunt at age
65. She is asymptomatic and has no personal history of breast
cancer.

EXAM:
BILATERAL BREAST MRI WITH AND WITHOUT CONTRAST
TECHNIQUE: Multiplanar, multisequence MR images of both breasts were obtained
prior to and following the intravenous administration of 7 ml of
Gadavist

[Series 2: t2_tirm_tra ipat (a-p) · axial · 3.0mm · 0.70mm/px · 1 of 67 slices shown]
[im 1/67]
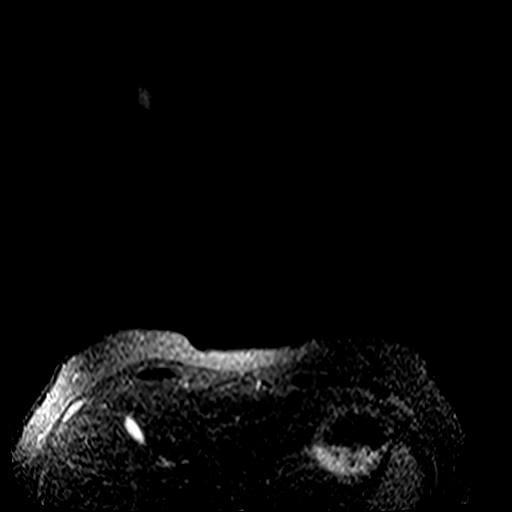

[Series 3: fl3d pre-cm non · axial · non-contrast · 1.2mm · 0.94mm/px · z∈[-129,+81]mm · 5 of 176 slices shown]
[im 1/176]
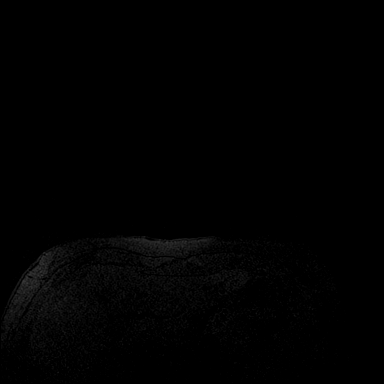
[im 44/176]
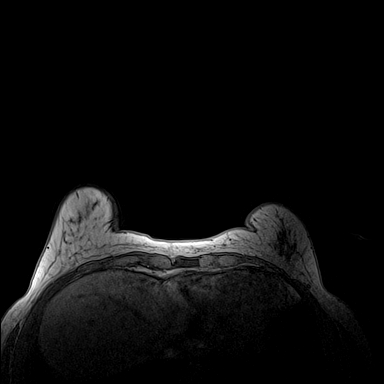
[im 88/176]
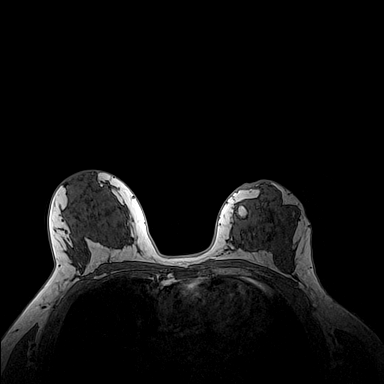
[im 132/176]
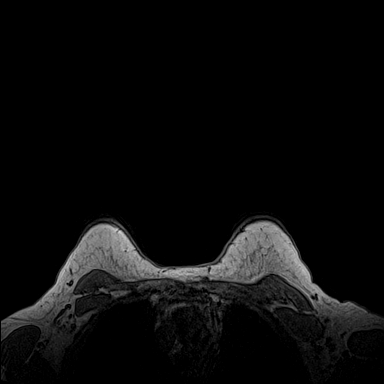
[im 176/176]
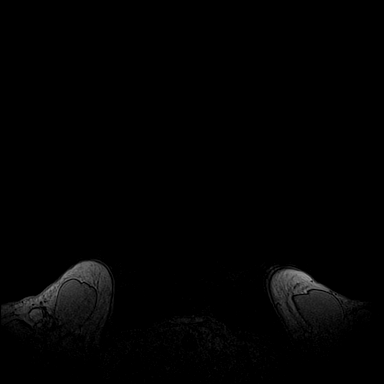

[Series 4: fl3d pre-cm · axial · non-contrast · 1.2mm · 0.94mm/px · z∈[-129,+81]mm · 5 of 176 slices shown]
[im 1/176]
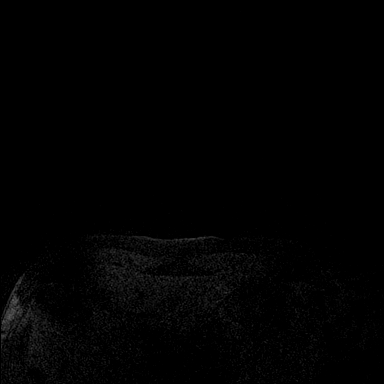
[im 44/176]
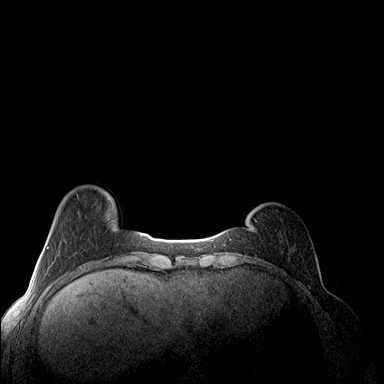
[im 88/176]
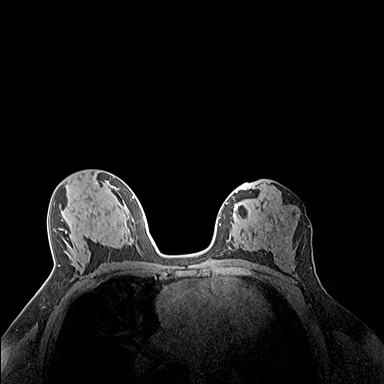
[im 132/176]
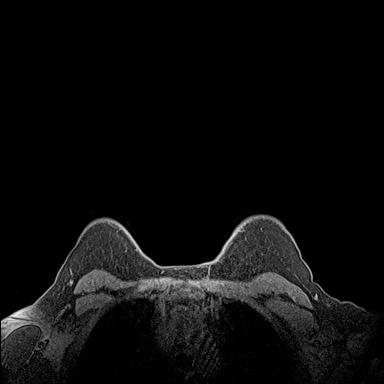
[im 176/176]
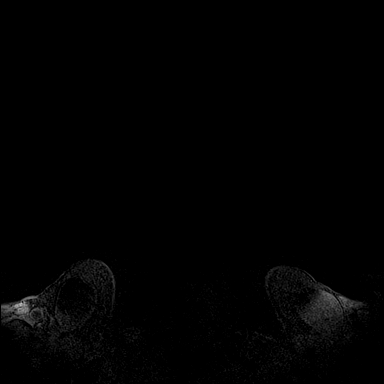

[Series 5: fl3d pre-cm 20 · axial · non-contrast · 1.2mm · 0.94mm/px · z∈[-129,+81]mm · 5 of 176 slices shown (1 of 3)]
[im 1/176]
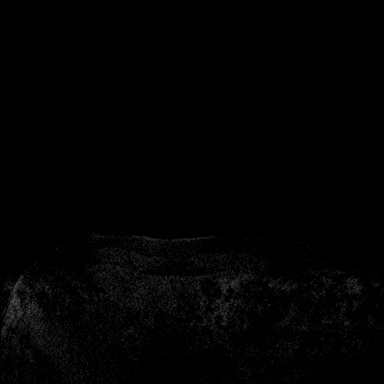
[im 44/176]
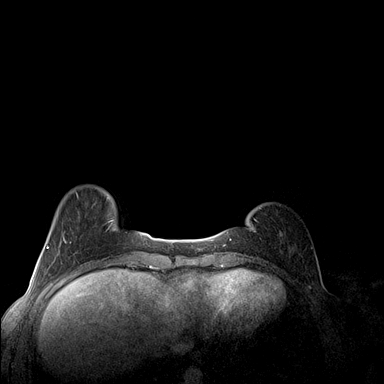
[im 88/176]
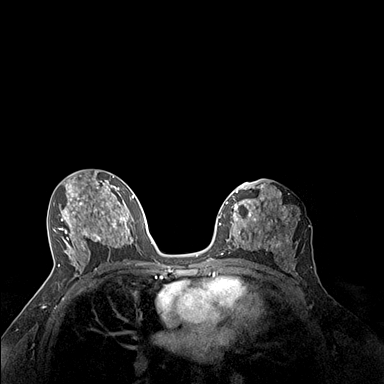
[im 132/176]
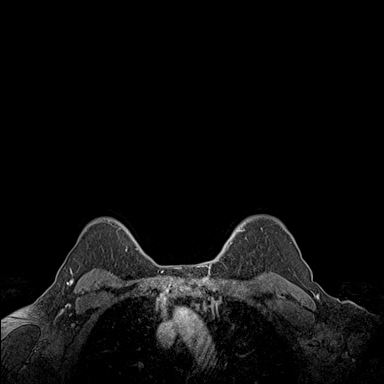
[im 176/176]
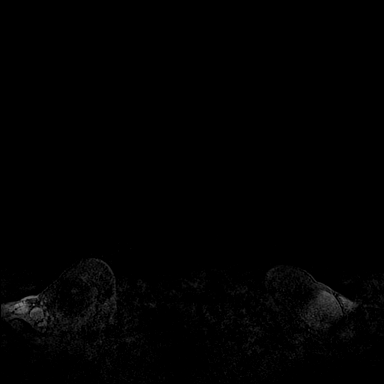

[Series 6: fl3d pre-cm 20 · axial · non-contrast · 1.2mm · 0.94mm/px · z∈[-129,+81]mm · 5 of 176 slices shown (2 of 3)]
[im 1/176]
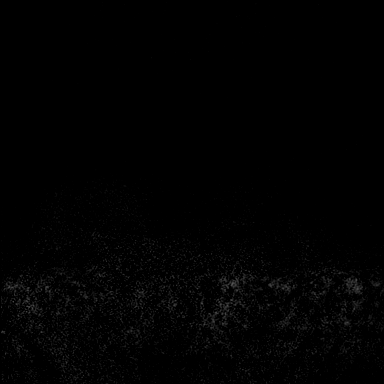
[im 44/176]
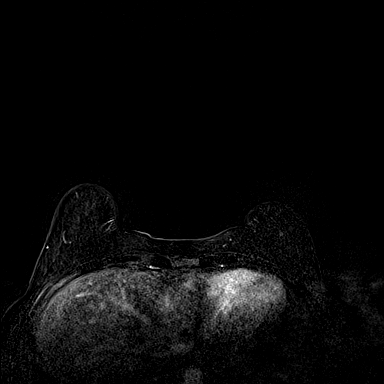
[im 88/176]
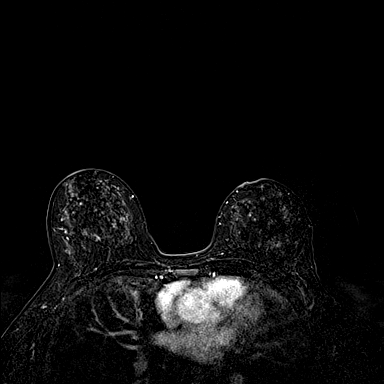
[im 132/176]
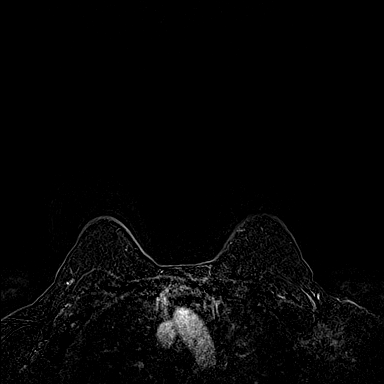
[im 176/176]
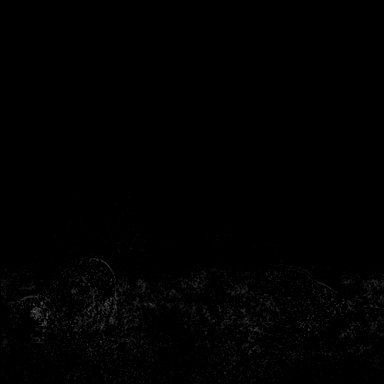

[Series 7: fl3d pre-cm 20 · axial · non-contrast · 211.2mm · 0.94mm/px · 1 of 1 slices shown (3 of 3)]
[im 1/1]
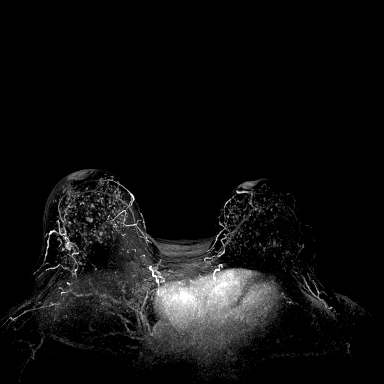

[Series 8: fl3d pre-cm 3min · axial · non-contrast · 1.2mm · 0.94mm/px · z∈[-129,+81]mm · 6 of 176 slices shown]
[im 1/176]
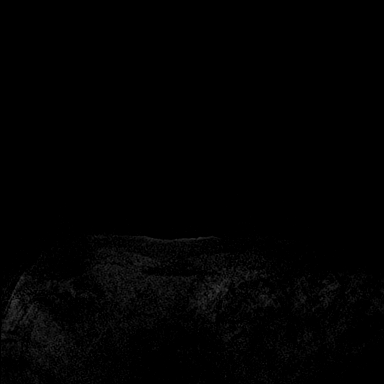
[im 36/176]
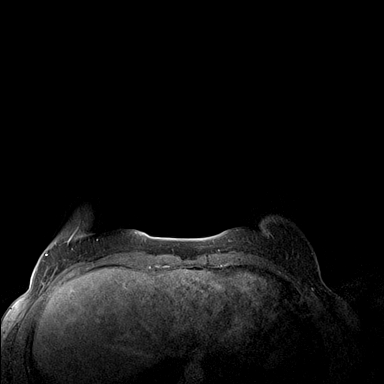
[im 71/176]
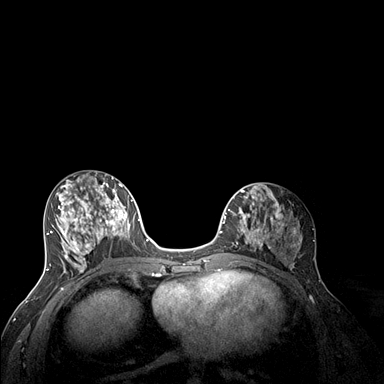
[im 106/176]
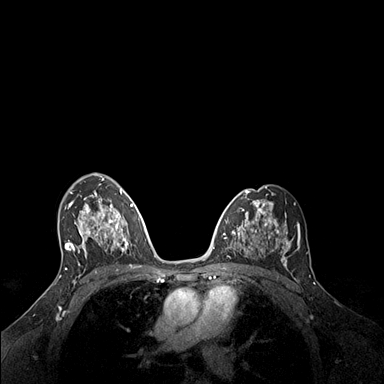
[im 141/176]
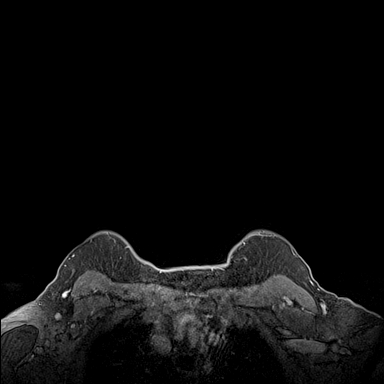
[im 176/176]
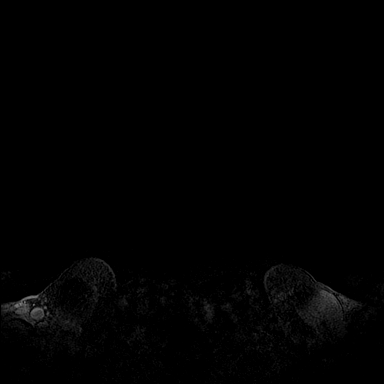

[Series 9: fl3d pre-cm 3min_sub · axial · non-contrast · 1.2mm · 0.94mm/px · z∈[-129,+39]mm · 5 of 176 slices shown]
[im 1/176]
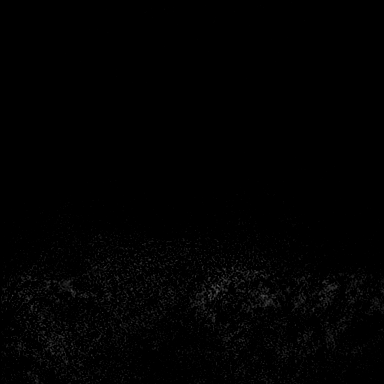
[im 36/176]
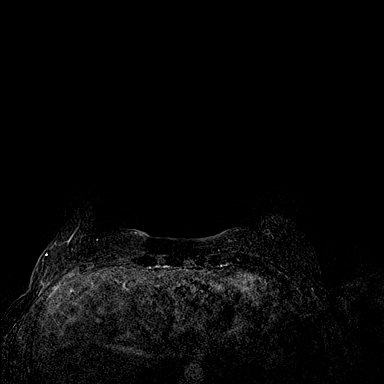
[im 71/176]
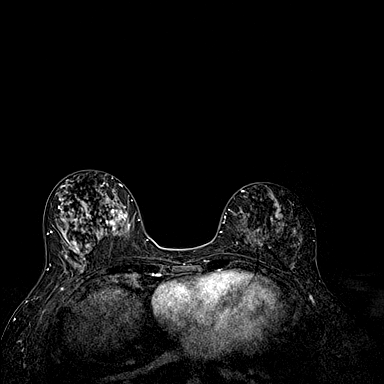
[im 106/176]
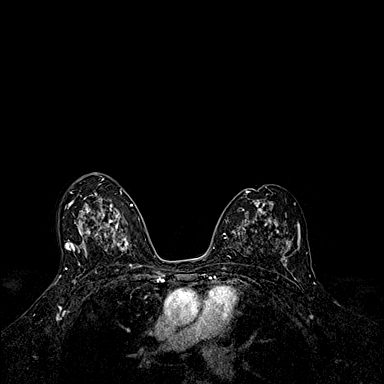
[im 141/176]
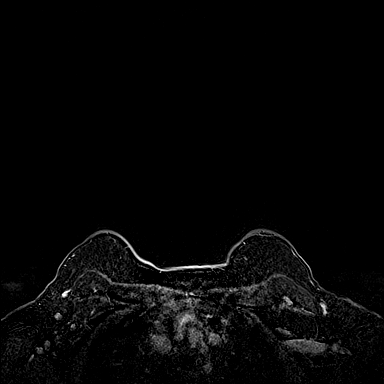

[33 of 48 positions shown; findings below may reference images not displayed]

Three-dimensional MR images were rendered by post-processing of the
original MR data on an independent workstation. The
three-dimensional MR images were interpreted, and findings are
reported in the following complete MRI report for this study. Three
dimensional images were evaluated at the independent interpreting
workstation using the DynaCAD thin client.
FINDINGS: Breast composition: d. Extreme fibroglandular tissue.

Background parenchymal enhancement: Moderate.

Right breast: No mass or abnormal enhancement.

Left breast: No mass or abnormal enhancement.

Lymph nodes: No abnormal appearing lymph nodes.

Ancillary findings:  None.
IMPRESSION: No evidence of malignancy in the bilateral breasts.

RECOMMENDATION:
1. Annual screening mammography and annual high risk screening MRI
is due in 1 year.

BI-RADS CATEGORY  1: Negative.

## 2021-11-16 MED ORDER — GADOBUTROL 1 MMOL/ML IV SOLN
7.0000 mL | Freq: Once | INTRAVENOUS | Status: AC | PRN
  Administered 2021-11-16: 7 mL via INTRAVENOUS

## 2021-12-28 ENCOUNTER — Encounter: Payer: Self-pay | Admitting: Gastroenterology

## 2022-01-10 ENCOUNTER — Ambulatory Visit (AMBULATORY_SURGERY_CENTER): Payer: BC Managed Care – PPO

## 2022-01-10 VITALS — Ht 65.5 in | Wt 156.0 lb

## 2022-01-10 DIAGNOSIS — Z1211 Encounter for screening for malignant neoplasm of colon: Secondary | ICD-10-CM

## 2022-01-10 MED ORDER — NA SULFATE-K SULFATE-MG SULF 17.5-3.13-1.6 GM/177ML PO SOLN: 1.0000 | Freq: Once | ORAL | 0 refills | Status: AC

## 2022-01-10 NOTE — Progress Notes (Signed)
No egg or soy allergy known to patient  No issues known to pt with past sedation with any surgeries or procedures Patient denies ever being told they had issues or difficulty with intubation  No FH of Malignant Hyperthermia Pt is not on diet pills Pt is not on home 02  Pt is not on blood thinners  Pt denies issues with constipation  No A fib or A flutter NO PA's for preps discussed with pt in PV today  Discussed with pt there will be an out-of-pocket cost for prep and that varies from $0 to 70 + dollars - pt verbalized understanding  Pt instructed to use Singlecare.com or GoodRx for a price reduction on prep  PV completed over the phone. Pt verified name, DOB, address and insurance during PV today.  Pt given instruction packet to read and not return Pt encouraged to call with questions or issues.  Pt has My chart, procedure instructions sent via My Chart  Insurance confirmed with pt at [facility] today

## 2022-02-01 ENCOUNTER — Encounter: Payer: Self-pay | Admitting: Gastroenterology

## 2022-02-07 ENCOUNTER — Encounter: Payer: Self-pay | Admitting: Gastroenterology

## 2022-02-07 ENCOUNTER — Ambulatory Visit (AMBULATORY_SURGERY_CENTER): Payer: BC Managed Care – PPO | Admitting: Gastroenterology

## 2022-02-07 VITALS — BP 104/58 | HR 72 | Temp 98.8°F | Resp 13 | Ht 65.5 in | Wt 156.0 lb

## 2022-02-07 DIAGNOSIS — Z1211 Encounter for screening for malignant neoplasm of colon: Secondary | ICD-10-CM | POA: Diagnosis present

## 2022-02-07 MED ORDER — SODIUM CHLORIDE 0.9 % IV SOLN: 500.0000 mL | Freq: Once | INTRAVENOUS | Status: DC

## 2022-02-07 NOTE — Progress Notes (Signed)
[facility] History and Physical   Primary Care Physician:  Patient, No Pcp Per   Reason for Procedure:   Colon cancer screening  Plan:    Screening colonoscopy     HPI: LEWANDA PEREA is a 46 y.o. female undergoing initial average risk screening colonoscopy.  She has no family history of colon cancer and no chronic GI symptoms.    Past Medical History:  Diagnosis Date   Anxiety    situational anxiety    Past Surgical History:  Procedure Laterality Date   WISDOM TOOTH EXTRACTION     WRIST SURGERY Left 2022   2 tumors removed from wrist    Prior to Admission medications   Medication Sig Start Date End Date Taking? Authorizing Provider  Multiple Vitamin (MULTIVITAMIN PO) Take 1 tablet by mouth daily at 6 (six) AM.    [provider]    Current Outpatient Medications  Medication Sig Dispense Refill   Multiple Vitamin (MULTIVITAMIN PO) Take 1 tablet by mouth daily at 6 (six) AM.     Current Facility-Administered Medications  Medication Dose Route Frequency Provider Last Rate Last Admin   0.9 %  sodium chloride infusion  500 mL Intravenous Once Jenel Lucks, MD        Allergies as of 02/07/2022 - Review Complete 02/07/2022  Allergen Reaction Noted   Sulfa antibiotics Hives 08/15/2021   Sulfamethoxazole Rash 11/20/2015    Family History  Problem Relation Age of Onset   Breast cancer Mother    Breast cancer Maternal Aunt    Colon polyps Neg Hx    Colon cancer Neg Hx    Esophageal cancer Neg Hx    Rectal cancer Neg Hx    Stomach cancer Neg Hx     Social History   Socioeconomic History   Marital status: Married    Spouse name: Not on file   Number of children: Not on file   Years of education: Not on file   Highest education level: Not on file  Occupational History   Not on file  Tobacco Use   Smoking status: Never   Smokeless tobacco: Never  Vaping Use   Vaping Use: Never used  Substance and Sexual Activity   Alcohol use:  Not Currently    Alcohol/week: 0.0 - 4.0 standard drinks of alcohol   Drug use: Never   Sexual activity: Yes  Other Topics Concern   Not on file  Social History Narrative   Not on file   Social Determinants of Health   Financial Resource Strain: Not on file  Food Insecurity: Not on file  Transportation Needs: Not on file  Physical Activity: Not on file  Stress: Not on file  Social Connections: Not on file  Intimate Partner Violence: Not on file    Review of Systems:  All other review of systems negative except as mentioned in the HPI.  Physical Exam: Vital signs BP (!) 103/58   Pulse 82   Temp 98.8 F (37.1 C) (Temporal)   Ht 5' 5.5" (1.664 m)   Wt 156 lb (70.8 kg)   SpO2 100%   BMI 25.56 kg/m   General:   Alert,  Well-developed, well-nourished, pleasant and cooperative in NAD Airway:  Mallampati 1 Lungs:  Clear throughout to auscultation.   Heart:  Regular rate and rhythm; no murmurs, clicks, rubs,  or gallops. Abdomen:  Soft, nontender and nondistended. Normal bowel sounds.   Neuro/Psych:  Normal mood and affect. A and O x  [place] Candis Schatz, MD [facility]

## 2022-02-07 NOTE — Patient Instructions (Signed)
Resume previous diet and medications.  Repeat colonoscopy in 10 years for screening purposes.   YOU HAD AN ENDOSCOPIC PROCEDURE TODAY AT THE [facility]:   Refer to the procedure report that was given to you for any specific questions about what was found during the examination.  If the procedure report does not answer your questions, please call your gastroenterologist to clarify.  If you requested that your care partner not be given the details of your procedure findings, then the procedure report has been included in a sealed envelope for you to review at your convenience later.  YOU SHOULD EXPECT: Some feelings of bloating in the abdomen. Passage of more gas than usual.  Walking can help get rid of the air that was put into your GI tract during the procedure and reduce the bloating. If you had a lower endoscopy (such as a colonoscopy or flexible sigmoidoscopy) you may notice spotting of blood in your stool or on the toilet paper. If you underwent a bowel prep for your procedure, you may not have a normal bowel movement for a few days.  Please Note:  You might notice some irritation and congestion in your nose or some drainage.  This is from the oxygen used during your procedure.  There is no need for concern and it should clear up in a day or so.  SYMPTOMS TO REPORT IMMEDIATELY:  Following lower endoscopy (colonoscopy or flexible sigmoidoscopy):  Excessive amounts of blood in the stool  Significant tenderness or worsening of abdominal pains  Swelling of the abdomen that is new, acute  Fever of 100F or higher  For urgent or emergent issues, a gastroenterologist can be reached at any hour by calling [phone]. Do not use MyChart messaging for urgent concerns.    DIET:  We do recommend a small meal at first, but then you may proceed to your regular diet.  Drink plenty of fluids but you should avoid alcoholic beverages for 24 hours.  ACTIVITY:  You should plan to take it  easy for the rest of today and you should NOT DRIVE or use heavy machinery until tomorrow (because of the sedation medicines used during the test).    FOLLOW UP: Our staff will call the number listed on your records 24-72 hours following your procedure to check on you and address any questions or concerns that you may have regarding the information given to you following your procedure. If we do not reach you, we will leave a message.  We will attempt to reach you two times.  During this call, we will ask if you have developed any symptoms of COVID 19. If you develop any symptoms (ie: fever, flu-like symptoms, shortness of breath, cough etc.) before then, please call [phone].  If you test positive for Covid 19 in the 2 weeks post procedure, please call and report this information to [place].    If any biopsies were taken you will be contacted by phone or by letter within the next 1-3 weeks.  Please call us at [phone] if you have not heard about the biopsies in 3 weeks.    SIGNATURES/CONFIDENTIALITY: You and/or your care partner have signed paperwork which will be entered into your electronic medical record.  These signatures attest to the fact that that the information above on your After Visit Summary has been reviewed and is understood.  Full responsibility of the confidentiality of this discharge information lies with you and/or your care-partner.

## 2022-02-07 NOTE — Progress Notes (Signed)
Vss nad trans to pacu °

## 2022-02-07 NOTE — Progress Notes (Signed)
I have reviewed the patient's medical history in detail and updated the computerized patient record.   Vs by CW.

## 2022-02-07 NOTE — Op Note (Signed)
[facility] Patient Name: Marie Stewart Procedure Date: 02/07/2022 10:12 AM MRN: [id number] Endoscopist: Lorin Picket E. Tomasa Rand , MD Age: 46 Referring MD:  Date of Birth: [birth year] Gender: Female Account #: [id number] Procedure:                Colonoscopy Indications:              Screening for colorectal malignant neoplasm, This                            is the patient's first colonoscopy Medicines:                Monitored Anesthesia Care Procedure:                Pre-Anesthesia Assessment:                           - Prior to the procedure, a History and Physical                            was performed, and patient medications and                            allergies were reviewed. The patient's tolerance of                            previous anesthesia was also reviewed. The risks                            and benefits of the procedure and the sedation                            options and risks were discussed with the patient.                            All questions were answered, and informed consent                            was obtained. Prior Anticoagulants: The patient has                            taken no previous anticoagulant or antiplatelet                            agents. ASA Grade Assessment: I - A normal, healthy                            patient. After reviewing the risks and benefits,                            the patient was deemed in satisfactory condition to                            undergo the procedure.  After obtaining informed consent, the colonoscope                            was passed under direct vision. Throughout the                            procedure, the patient's blood pressure, pulse, and                            oxygen saturations were monitored continuously. The                            CF [id number] #[id number] was introduced through the anus                            and advanced to the the terminal  ileum, with                            identification of the appendiceal orifice and IC                            valve. The colonoscopy was performed with moderate                            difficulty due to significant looping and a                            tortuous colon. Successful completion of the                            procedure was aided by using manual pressure and                            withdrawing and reinserting the scope. The patient                            tolerated the procedure well. The quality of the                            bowel preparation was excellent. The terminal                            ileum, ileocecal valve, appendiceal orifice, and                            rectum were photographed. The bowel preparation                            used was SUPREP via split dose instruction. Scope In: 10:23:53 AM Scope Out: 10:52:08 AM Scope Withdrawal Time: 0 hours 8 minutes 11 seconds  Total Procedure Duration: 0 hours 28 minutes 15 seconds  Findings:                 The perianal and digital rectal  examinations were                            normal. Pertinent negatives include normal                            sphincter tone and no palpable rectal lesions.                           The colon (entire examined portion) appeared normal.                           The terminal ileum appeared normal.                           The retroflexed view of the distal rectum and anal                            verge was normal and showed no anal or rectal                            abnormalities. Complications:            No immediate complications. Estimated Blood Loss:     Estimated blood loss: none. Impression:               - The entire examined colon is normal.                           - The examined portion of the ileum was normal.                           - The distal rectum and anal verge are normal on                            retroflexion view.                            - No specimens collected. Recommendation:           - Patient has a contact number available for                            emergencies. The signs and symptoms of potential                            delayed complications were discussed with the                            patient. Return to normal activities tomorrow.                            Written discharge instructions were provided to the                            patient.                           -  Resume previous diet.                           - Continue present medications.                           - Repeat colonoscopy in 10 years for screening                            purposes. Lacee Grey E. Tomasa Rand, MD 02/07/2022 10:58:27 AM This report has been signed electronically.

## 2022-02-08 ENCOUNTER — Telehealth: Payer: Self-pay

## 2022-02-08 NOTE — Telephone Encounter (Signed)
  Follow up Call-     02/07/2022   10:00 AM  Call back number  Post procedure Call Back phone  # [phone]  Permission to leave phone message Yes     Patient questions:  Do you have a fever, pain , or abdominal swelling? No. Pain Score  0 *  Have you tolerated food without any problems? Yes.    Have you been able to return to your normal activities? Yes.    Do you have any questions about your discharge instructions: Diet   No. Medications  No. Follow up visit  No.  Do you have questions or concerns about your Care? No.  Actions: * If pain score is 4 or above: No action needed, pain <4.

## 2023-05-15 ENCOUNTER — Other Ambulatory Visit: Payer: Self-pay | Admitting: Obstetrics and Gynecology

## 2023-05-15 DIAGNOSIS — Z9189 Other specified personal risk factors, not elsewhere classified: Secondary | ICD-10-CM

## 2023-07-01 ENCOUNTER — Encounter: Payer: Self-pay | Admitting: Obstetrics and Gynecology

## 2023-07-02 ENCOUNTER — Encounter: Payer: Self-pay | Admitting: Obstetrics and Gynecology

## 2023-07-03 ENCOUNTER — Encounter: Payer: Self-pay | Admitting: Obstetrics and Gynecology

## 2023-07-04 ENCOUNTER — Encounter: Payer: Self-pay | Admitting: Obstetrics and Gynecology

## 2023-07-05 ENCOUNTER — Ambulatory Visit
Admission: RE | Admit: 2023-07-05 | Discharge: 2023-07-05 | Disposition: A | Discharge status: Home / Self Care | Payer: No Typology Code available for payment source | Source: Ambulatory Visit | Attending: Obstetrics and Gynecology | Admitting: Obstetrics and Gynecology

## 2023-07-05 DIAGNOSIS — Z9189 Other specified personal risk factors, not elsewhere classified: Secondary | ICD-10-CM

## 2023-07-05 MED ORDER — GADOPICLENOL 0.5 MMOL/ML IV SOLN
7.0000 mL | Freq: Once | INTRAVENOUS | Status: AC | PRN
  Administered 2023-07-05: 7 mL via INTRAVENOUS

## 2024-02-20 ENCOUNTER — Other Ambulatory Visit: Payer: Self-pay | Admitting: Obstetrics and Gynecology

## 2024-02-20 DIAGNOSIS — Z803 Family history of malignant neoplasm of breast: Secondary | ICD-10-CM
